# Patient Record
Sex: Male | Born: 1990 | Race: White | Hispanic: No | Marital: Single | Smoking: Current some day smoker
Health system: Southern US, Community
[De-identification: ages and names within clinical notes are randomized; demographics above are authoritative.]

## PROBLEM LIST (undated history)

## (undated) DIAGNOSIS — S52501A Unspecified fracture of the lower end of right radius, initial encounter for closed fracture: Secondary | ICD-10-CM

## (undated) DIAGNOSIS — M12569 Traumatic arthropathy, unspecified knee: Secondary | ICD-10-CM

## (undated) DIAGNOSIS — S83529A Sprain of posterior cruciate ligament of unspecified knee, initial encounter: Secondary | ICD-10-CM

## (undated) DIAGNOSIS — S73005A Unspecified dislocation of left hip, initial encounter: Secondary | ICD-10-CM

## (undated) DIAGNOSIS — S32402A Unspecified fracture of left acetabulum, initial encounter for closed fracture: Secondary | ICD-10-CM

## (undated) HISTORY — PX: OTHER SURGICAL HISTORY: SHX169

---
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
## 2016-01-19 ENCOUNTER — Encounter (HOSPITAL_COMMUNITY): Payer: Self-pay

## 2016-01-19 ENCOUNTER — Emergency Department (HOSPITAL_COMMUNITY): Payer: Self-pay

## 2016-01-19 ENCOUNTER — Emergency Department (HOSPITAL_COMMUNITY): Payer: MEDICAID

## 2016-01-19 ENCOUNTER — Inpatient Hospital Stay (HOSPITAL_COMMUNITY)
Admission: EM | Admit: 2016-01-19 | Discharge: 2016-01-28 | DRG: 481 | Disposition: A | Discharge status: Home / Self Care | Payer: Self-pay | Attending: Orthopedic Surgery | Admitting: Orthopedic Surgery

## 2016-01-19 DIAGNOSIS — S0181XA Laceration without foreign body of other part of head, initial encounter: Secondary | ICD-10-CM | POA: Diagnosis present

## 2016-01-19 DIAGNOSIS — F17210 Nicotine dependence, cigarettes, uncomplicated: Secondary | ICD-10-CM | POA: Diagnosis present

## 2016-01-19 DIAGNOSIS — F10929 Alcohol use, unspecified with intoxication, unspecified: Secondary | ICD-10-CM | POA: Diagnosis present

## 2016-01-19 DIAGNOSIS — D62 Acute posthemorrhagic anemia: Secondary | ICD-10-CM | POA: Diagnosis not present

## 2016-01-19 DIAGNOSIS — S81012A Laceration without foreign body, left knee, initial encounter: Secondary | ICD-10-CM | POA: Diagnosis present

## 2016-01-19 DIAGNOSIS — T148XXA Other injury of unspecified body region, initial encounter: Secondary | ICD-10-CM

## 2016-01-19 DIAGNOSIS — S73005A Unspecified dislocation of left hip, initial encounter: Secondary | ICD-10-CM | POA: Diagnosis present

## 2016-01-19 DIAGNOSIS — M25362 Other instability, left knee: Secondary | ICD-10-CM

## 2016-01-19 DIAGNOSIS — Z419 Encounter for procedure for purposes other than remedying health state, unspecified: Secondary | ICD-10-CM

## 2016-01-19 DIAGNOSIS — S73006A Unspecified dislocation of unspecified hip, initial encounter: Secondary | ICD-10-CM | POA: Diagnosis present

## 2016-01-19 DIAGNOSIS — S62101A Fracture of unspecified carpal bone, right wrist, initial encounter for closed fracture: Secondary | ICD-10-CM

## 2016-01-19 DIAGNOSIS — S83529A Sprain of posterior cruciate ligament of unspecified knee, initial encounter: Secondary | ICD-10-CM | POA: Diagnosis present

## 2016-01-19 DIAGNOSIS — S62109A Fracture of unspecified carpal bone, unspecified wrist, initial encounter for closed fracture: Secondary | ICD-10-CM

## 2016-01-19 DIAGNOSIS — W19XXXA Unspecified fall, initial encounter: Secondary | ICD-10-CM

## 2016-01-19 DIAGNOSIS — S32422A Displaced fracture of posterior wall of left acetabulum, initial encounter for closed fracture: Principal | ICD-10-CM | POA: Diagnosis present

## 2016-01-19 DIAGNOSIS — S32402A Unspecified fracture of left acetabulum, initial encounter for closed fracture: Secondary | ICD-10-CM | POA: Diagnosis present

## 2016-01-19 DIAGNOSIS — S83412A Sprain of medial collateral ligament of left knee, initial encounter: Secondary | ICD-10-CM | POA: Diagnosis present

## 2016-01-19 DIAGNOSIS — M12569 Traumatic arthropathy, unspecified knee: Secondary | ICD-10-CM | POA: Diagnosis present

## 2016-01-19 DIAGNOSIS — G839 Paralytic syndrome, unspecified: Secondary | ICD-10-CM | POA: Diagnosis present

## 2016-01-19 DIAGNOSIS — S52611A Displaced fracture of right ulna styloid process, initial encounter for closed fracture: Secondary | ICD-10-CM | POA: Diagnosis present

## 2016-01-19 DIAGNOSIS — G8918 Other acute postprocedural pain: Secondary | ICD-10-CM

## 2016-01-19 DIAGNOSIS — M21372 Foot drop, left foot: Secondary | ICD-10-CM | POA: Diagnosis present

## 2016-01-19 DIAGNOSIS — S52501A Unspecified fracture of the lower end of right radius, initial encounter for closed fracture: Secondary | ICD-10-CM | POA: Diagnosis present

## 2016-01-19 HISTORY — DX: Unspecified dislocation of left hip, initial encounter: S73.005A

## 2016-01-19 HISTORY — DX: Sprain of posterior cruciate ligament of unspecified knee, initial encounter: S83.529A

## 2016-01-19 HISTORY — DX: Unspecified fracture of the lower end of right radius, initial encounter for closed fracture: S52.501A

## 2016-01-19 HISTORY — DX: Unspecified fracture of left acetabulum, initial encounter for closed fracture: S32.402A

## 2016-01-19 HISTORY — DX: Traumatic arthropathy, unspecified knee: M12.569

## 2016-01-19 LAB — COMPREHENSIVE METABOLIC PANEL
ALBUMIN: 3.3 g/dL — AB (ref 3.5–5.0)
ALT: 71 U/L — AB (ref 17–63)
AST: 160 U/L — AB (ref 15–41)
Alkaline Phosphatase: 37 U/L — ABNORMAL LOW (ref 38–126)
Anion gap: 12 (ref 5–15)
BILIRUBIN TOTAL: 0.5 mg/dL (ref 0.3–1.2)
BUN: 8 mg/dL (ref 6–20)
CO2: 18 mmol/L — ABNORMAL LOW (ref 22–32)
CREATININE: 1.1 mg/dL (ref 0.61–1.24)
Calcium: 8.9 mg/dL (ref 8.9–10.3)
Chloride: 109 mmol/L (ref 101–111)
GFR calc Af Amer: >60 mL/min (ref >60)
GLUCOSE: 139 mg/dL — AB (ref 65–99)
POTASSIUM: 3.6 mmol/L (ref 3.5–5.1)
Sodium: 139 mmol/L (ref 135–145)
TOTAL PROTEIN: 5.9 g/dL — AB (ref 6.5–8.1)

## 2016-01-19 LAB — SAMPLE TO BLOOD BANK

## 2016-01-19 LAB — I-STAT CHEM 8, ED
BUN: 9 mg/dL (ref 6–20)
CHLORIDE: 107 mmol/L (ref 101–111)
CREATININE: 1.2 mg/dL (ref 0.61–1.24)
Calcium, Ion: 1.07 mmol/L — ABNORMAL LOW (ref 1.13–1.30)
Glucose, Bld: 135 mg/dL — ABNORMAL HIGH (ref 65–99)
HEMATOCRIT: 47 % (ref 39.0–52.0)
Hemoglobin: 16 g/dL (ref 13.0–17.0)
POTASSIUM: 3.6 mmol/L (ref 3.5–5.1)
SODIUM: 141 mmol/L (ref 135–145)
TCO2: 18 mmol/L (ref 0–100)

## 2016-01-19 LAB — CBC
HEMATOCRIT: 46.6 % (ref 39.0–52.0)
Hemoglobin: 16 g/dL (ref 13.0–17.0)
MCH: 32 pg (ref 26.0–34.0)
MCHC: 34.3 g/dL (ref 30.0–36.0)
MCV: 93.2 fL (ref 78.0–100.0)
PLATELETS: 215 10*3/uL (ref 150–400)
RBC: 5 MIL/uL (ref 4.22–5.81)
RDW: 14.2 % (ref 11.5–15.5)
WBC: 5.8 10*3/uL (ref 4.0–10.5)

## 2016-01-19 LAB — I-STAT CG4 LACTIC ACID, ED: Lactic Acid, Venous: 4.63 mmol/L (ref 0.5–1.9)

## 2016-01-19 LAB — ETHANOL: Alcohol, Ethyl (B): 139 mg/dL — ABNORMAL HIGH (ref <5)

## 2016-01-19 LAB — PROTIME-INR
INR: 1.02 (ref 0.00–1.49)
Prothrombin Time: 13.6 seconds (ref 11.6–15.2)

## 2016-01-19 MED ORDER — HYDROMORPHONE HCL 1 MG/ML IJ SOLN
1.0000 mg | Freq: Once | INTRAMUSCULAR | Status: AC
  Administered 2016-01-19: 1 mg via INTRAVENOUS

## 2016-01-19 MED ORDER — CEFAZOLIN IN D5W 1 GM/50ML IV SOLN
1.0000 g | Freq: Once | INTRAVENOUS | Status: AC
  Administered 2016-01-20: 1 g via INTRAVENOUS
  Filled 2016-01-19: qty 50

## 2016-01-19 MED ORDER — TETANUS-DIPHTH-ACELL PERTUSSIS 5-2.5-18.5 LF-MCG/0.5 IM SUSP
0.5000 mL | Freq: Once | INTRAMUSCULAR | Status: AC
  Administered 2016-01-20: 0.5 mL via INTRAMUSCULAR
  Filled 2016-01-19: qty 0.5

## 2016-01-19 MED ORDER — HYDROMORPHONE HCL 1 MG/ML IJ SOLN
INTRAMUSCULAR | Status: AC
  Filled 2016-01-19: qty 1

## 2016-01-19 MED ORDER — IOPAMIDOL (ISOVUE-300) INJECTION 61%
INTRAVENOUS | Status: AC
  Administered 2016-01-19: 75 mL via INTRAVENOUS
  Filled 2016-01-19: qty 100

## 2016-01-19 MED ORDER — PROPOFOL 10 MG/ML IV BOLUS
0.5000 mg/kg | Freq: Once | INTRAVENOUS | Status: AC
  Administered 2016-01-20: 39.7 mg via INTRAVENOUS
  Filled 2016-01-19: qty 20

## 2016-01-19 MED ORDER — KETAMINE HCL-SODIUM CHLORIDE 100-0.9 MG/10ML-% IV SOSY
1.0000 mg/kg | PREFILLED_SYRINGE | Freq: Once | INTRAVENOUS | Status: DC
  Filled 2016-01-19: qty 10
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
## 2016-01-19 NOTE — ED Notes (Signed)
Dr. Rhunette CroftNanavati and respiratory at bedside
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
## 2016-01-19 NOTE — ED Notes (Signed)
Patient transported to CT 

## 2016-01-19 NOTE — ED Notes (Signed)
Consent obtained

## 2016-01-20 ENCOUNTER — Inpatient Hospital Stay (HOSPITAL_COMMUNITY): Payer: MEDICAID

## 2016-01-20 ENCOUNTER — Inpatient Hospital Stay (HOSPITAL_COMMUNITY): Payer: Self-pay

## 2016-01-20 ENCOUNTER — Emergency Department (HOSPITAL_COMMUNITY): Payer: Self-pay

## 2016-01-20 DIAGNOSIS — S73005A Unspecified dislocation of left hip, initial encounter: Secondary | ICD-10-CM | POA: Diagnosis present

## 2016-01-20 DIAGNOSIS — S32402A Unspecified fracture of left acetabulum, initial encounter for closed fracture: Secondary | ICD-10-CM | POA: Diagnosis present

## 2016-01-20 DIAGNOSIS — S73006A Unspecified dislocation of unspecified hip, initial encounter: Secondary | ICD-10-CM | POA: Diagnosis present

## 2016-01-20 LAB — RAPID URINE DRUG SCREEN, HOSP PERFORMED
AMPHETAMINES: NOT DETECTED
Barbiturates: NOT DETECTED
Benzodiazepines: NOT DETECTED
COCAINE: NOT DETECTED
OPIATES: POSITIVE — AB
Tetrahydrocannabinol: NOT DETECTED

## 2016-01-20 LAB — URINE MICROSCOPIC-ADD ON
Bacteria, UA: NONE SEEN
RBC / HPF: NONE SEEN RBC/hpf (ref 0–5)

## 2016-01-20 LAB — CBC
HCT: 45.2 % (ref 39.0–52.0)
HEMOGLOBIN: 15.8 g/dL (ref 13.0–17.0)
MCH: 31.9 pg (ref 26.0–34.0)
MCHC: 35 g/dL (ref 30.0–36.0)
MCV: 91.3 fL (ref 78.0–100.0)
Platelets: 218 10*3/uL (ref 150–400)
RBC: 4.95 MIL/uL (ref 4.22–5.81)
RDW: 14.1 % (ref 11.5–15.5)
WBC: 7.3 10*3/uL (ref 4.0–10.5)

## 2016-01-20 LAB — URINALYSIS, ROUTINE W REFLEX MICROSCOPIC
BILIRUBIN URINE: NEGATIVE
Bilirubin Urine: NEGATIVE
GLUCOSE, UA: NEGATIVE mg/dL
Glucose, UA: NEGATIVE mg/dL
KETONES UR: NEGATIVE mg/dL
KETONES UR: 15 mg/dL — AB
Leukocytes, UA: NEGATIVE
Leukocytes, UA: NEGATIVE
NITRITE: NEGATIVE
Nitrite: NEGATIVE
PH: 5.5 (ref 5.0–8.0)
PROTEIN: 100 mg/dL — AB
Protein, ur: NEGATIVE mg/dL
Specific Gravity, Urine: 1.042 — ABNORMAL HIGH (ref 1.005–1.030)
Specific Gravity, Urine: 1.026 (ref 1.005–1.030)
pH: 6.5 (ref 5.0–8.0)

## 2016-01-20 LAB — APTT: aPTT: 28 seconds (ref 24–37)

## 2016-01-20 LAB — COMPREHENSIVE METABOLIC PANEL
ALK PHOS: 37 U/L — AB (ref 38–126)
ALT: 68 U/L — AB (ref 17–63)
AST: 162 U/L — AB (ref 15–41)
Albumin: 3.3 g/dL — ABNORMAL LOW (ref 3.5–5.0)
Anion gap: 5 (ref 5–15)
BUN: 7 mg/dL (ref 6–20)
CALCIUM: 8.8 mg/dL — AB (ref 8.9–10.3)
CHLORIDE: 107 mmol/L (ref 101–111)
CO2: 26 mmol/L (ref 22–32)
CREATININE: 1 mg/dL (ref 0.61–1.24)
Glucose, Bld: 115 mg/dL — ABNORMAL HIGH (ref 65–99)
Potassium: 3.9 mmol/L (ref 3.5–5.1)
SODIUM: 138 mmol/L (ref 135–145)
Total Bilirubin: 1.6 mg/dL — ABNORMAL HIGH (ref 0.3–1.2)
Total Protein: 6.1 g/dL — ABNORMAL LOW (ref 6.5–8.1)

## 2016-01-20 LAB — PROTIME-INR
INR: 0.99 (ref 0.00–1.49)
PROTHROMBIN TIME: 13.3 s (ref 11.6–15.2)

## 2016-01-20 LAB — CDS SEROLOGY

## 2016-01-20 LAB — LACTIC ACID, PLASMA: Lactic Acid, Venous: 1.5 mmol/L (ref 0.5–1.9)

## 2016-01-20 MED ORDER — ENOXAPARIN SODIUM 40 MG/0.4ML ~~LOC~~ SOLN
40.0000 mg | SUBCUTANEOUS | Status: DC
  Administered 2016-01-20 – 2016-01-22 (×3): 40 mg via SUBCUTANEOUS
  Filled 2016-01-20 (×3): qty 0.4

## 2016-01-20 MED ORDER — FENTANYL CITRATE (PF) 100 MCG/2ML IJ SOLN
INTRAMUSCULAR | Status: AC
  Filled 2016-01-20: qty 2

## 2016-01-20 MED ORDER — METHOCARBAMOL 1000 MG/10ML IJ SOLN
500.0000 mg | Freq: Four times a day (QID) | INTRAVENOUS | Status: DC | PRN
  Filled 2016-01-20: qty 5

## 2016-01-20 MED ORDER — VITAMIN B-1 100 MG PO TABS
100.0000 mg | ORAL_TABLET | Freq: Every day | ORAL | Status: DC
  Administered 2016-01-21 – 2016-01-28 (×7): 100 mg via ORAL
  Filled 2016-01-20 (×7): qty 1

## 2016-01-20 MED ORDER — DOCUSATE SODIUM 100 MG PO CAPS
100.0000 mg | ORAL_CAPSULE | Freq: Two times a day (BID) | ORAL | Status: DC
Start: 2016-01-20 — End: 2016-01-24
  Administered 2016-01-20 – 2016-01-23 (×8): 100 mg via ORAL
  Filled 2016-01-20 (×8): qty 1

## 2016-01-20 MED ORDER — ETOMIDATE 2 MG/ML IV SOLN
12.0000 mg | Freq: Once | INTRAVENOUS | Status: DC
  Filled 2016-01-20: qty 10

## 2016-01-20 MED ORDER — ACETAMINOPHEN 325 MG PO TABS: 650.0000 mg | ORAL_TABLET | Freq: Four times a day (QID) | ORAL | Status: DC | PRN

## 2016-01-20 MED ORDER — FENTANYL CITRATE (PF) 100 MCG/2ML IJ SOLN
100.0000 ug | Freq: Once | INTRAMUSCULAR | Status: AC
  Administered 2016-01-20: 100 ug via INTRAVENOUS

## 2016-01-20 MED ORDER — FENTANYL CITRATE (PF) 100 MCG/2ML IJ SOLN
50.0000 ug | Freq: Once | INTRAMUSCULAR | Status: AC
  Administered 2016-01-20: 50 ug via INTRAVENOUS
  Filled 2016-01-20: qty 2

## 2016-01-20 MED ORDER — ACETAMINOPHEN 650 MG RE SUPP: 650.0000 mg | Freq: Four times a day (QID) | RECTAL | Status: DC | PRN

## 2016-01-20 MED ORDER — METHOCARBAMOL 1000 MG/10ML IJ SOLN
1000.0000 mg | Freq: Once | INTRAVENOUS | Status: AC
  Administered 2016-01-20: 1000 mg via INTRAVENOUS
  Filled 2016-01-20: qty 10

## 2016-01-20 MED ORDER — FENTANYL CITRATE (PF) 100 MCG/2ML IJ SOLN
100.0000 ug | Freq: Once | INTRAMUSCULAR | Status: AC
Start: 2016-01-20 — End: 2016-01-20
  Administered 2016-01-20: 100 ug via INTRAVENOUS
  Filled 2016-01-20: qty 2

## 2016-01-20 MED ORDER — SODIUM CHLORIDE 0.9 % IV SOLN
INTRAVENOUS | Status: DC
  Administered 2016-01-20: 04:00:00 via INTRAVENOUS

## 2016-01-20 MED ORDER — FOLIC ACID 1 MG PO TABS
1.0000 mg | ORAL_TABLET | Freq: Every day | ORAL | Status: DC
  Administered 2016-01-21 – 2016-01-28 (×7): 1 mg via ORAL
  Filled 2016-01-20 (×7): qty 1

## 2016-01-20 MED ORDER — POTASSIUM CHLORIDE IN NACL 20-0.9 MEQ/L-% IV SOLN
INTRAVENOUS | Status: DC
  Administered 2016-01-20 – 2016-01-24 (×11): via INTRAVENOUS
  Filled 2016-01-20 (×11): qty 1000

## 2016-01-20 MED ORDER — DIPHENHYDRAMINE HCL 12.5 MG/5ML PO ELIX: 12.5000 mg | ORAL_SOLUTION | ORAL | Status: DC | PRN

## 2016-01-20 MED ORDER — ADULT MULTIVITAMIN W/MINERALS CH
1.0000 | ORAL_TABLET | Freq: Every day | ORAL | Status: DC
  Administered 2016-01-21 – 2016-01-28 (×7): 1 via ORAL
  Filled 2016-01-20 (×7): qty 1

## 2016-01-20 MED ORDER — HYDROMORPHONE HCL 1 MG/ML IJ SOLN
1.0000 mg | INTRAMUSCULAR | Status: DC | PRN
  Administered 2016-01-20 – 2016-01-24 (×28): 1 mg via INTRAVENOUS
  Filled 2016-01-20 (×30): qty 1

## 2016-01-20 MED ORDER — CEFAZOLIN SODIUM-DEXTROSE 2-4 GM/100ML-% IV SOLN
2.0000 g | Freq: Once | INTRAVENOUS | Status: AC
  Administered 2016-01-20: 2 g via INTRAVENOUS
  Filled 2016-01-20 (×2): qty 100

## 2016-01-20 MED ORDER — ONDANSETRON HCL 4 MG/2ML IJ SOLN
4.0000 mg | Freq: Four times a day (QID) | INTRAMUSCULAR | Status: DC | PRN
  Administered 2016-01-24: 4 mg via INTRAVENOUS

## 2016-01-20 MED ORDER — THIAMINE HCL 100 MG/ML IJ SOLN: 100.0000 mg | Freq: Every day | INTRAMUSCULAR | Status: DC

## 2016-01-20 MED ORDER — ETOMIDATE 2 MG/ML IV SOLN
10.0000 mg | Freq: Once | INTRAVENOUS | Status: AC
  Administered 2016-01-20: 10 mg via INTRAVENOUS

## 2016-01-20 MED ORDER — OXYCODONE-ACETAMINOPHEN 5-325 MG PO TABS
1.0000 | ORAL_TABLET | Freq: Four times a day (QID) | ORAL | Status: DC | PRN
  Administered 2016-01-20 – 2016-01-22 (×5): 2 via ORAL
  Administered 2016-01-23: 1 via ORAL
  Administered 2016-01-23 (×2): 2 via ORAL
  Filled 2016-01-20: qty 1
  Filled 2016-01-20 (×9): qty 2

## 2016-01-20 MED ORDER — LORAZEPAM 1 MG PO TABS
1.0000 mg | ORAL_TABLET | Freq: Four times a day (QID) | ORAL | Status: AC | PRN
  Administered 2016-01-20 – 2016-01-23 (×5): 1 mg via ORAL
  Filled 2016-01-20 (×5): qty 1

## 2016-01-20 MED ORDER — LORAZEPAM 2 MG/ML IJ SOLN: 1.0000 mg | Freq: Four times a day (QID) | INTRAMUSCULAR | Status: AC | PRN

## 2016-01-20 MED ORDER — LIDOCAINE-EPINEPHRINE (PF) 2 %-1:200000 IJ SOLN
10.0000 mL | Freq: Once | INTRAMUSCULAR | Status: AC
  Administered 2016-01-20: 10 mL
  Filled 2016-01-20: qty 20

## 2016-01-20 MED ORDER — OXYCODONE HCL 5 MG PO TABS
5.0000 mg | ORAL_TABLET | ORAL | Status: DC | PRN
  Administered 2016-01-21 – 2016-01-24 (×11): 10 mg via ORAL
  Filled 2016-01-20 (×11): qty 2

## 2016-01-20 MED ORDER — LIDOCAINE-EPINEPHRINE 1 %-1:100000 IJ SOLN
20.0000 mL | INTRAMUSCULAR | Status: AC
  Administered 2016-01-20: 20 mL
  Filled 2016-01-20: qty 20

## 2016-01-20 MED ORDER — POLYETHYLENE GLYCOL 3350 17 G PO PACK
17.0000 g | PACK | Freq: Every day | ORAL | Status: DC
  Administered 2016-01-20 – 2016-01-28 (×8): 17 g via ORAL
  Filled 2016-01-20 (×8): qty 1

## 2016-01-20 MED ORDER — METHOCARBAMOL 500 MG PO TABS
500.0000 mg | ORAL_TABLET | Freq: Four times a day (QID) | ORAL | Status: DC | PRN
  Administered 2016-01-20 (×2): 500 mg via ORAL
  Filled 2016-01-20 (×2): qty 1

## 2016-01-20 MED ORDER — KETAMINE HCL-SODIUM CHLORIDE 100-0.9 MG/10ML-% IV SOSY
60.0000 mg | PREFILLED_SYRINGE | Freq: Once | INTRAVENOUS | Status: AC
  Administered 2016-01-20: 60 mg via INTRAVENOUS

## 2016-01-20 MED ORDER — METHOCARBAMOL 500 MG PO TABS
500.0000 mg | ORAL_TABLET | Freq: Four times a day (QID) | ORAL | Status: DC | PRN
Start: 2016-01-20 — End: 2016-01-28
  Administered 2016-01-20 – 2016-01-25 (×13): 1000 mg via ORAL
  Administered 2016-01-25: 500 mg via ORAL
  Administered 2016-01-26 – 2016-01-28 (×3): 1000 mg via ORAL
  Filled 2016-01-20 (×10): qty 2
  Filled 2016-01-20: qty 1
  Filled 2016-01-20 (×6): qty 2

## 2016-01-20 MED ORDER — ACETAMINOPHEN 325 MG PO TABS
650.0000 mg | ORAL_TABLET | Freq: Four times a day (QID) | ORAL | Status: DC | PRN
  Administered 2016-01-20 – 2016-01-21 (×2): 650 mg via ORAL
  Filled 2016-01-20 (×2): qty 2
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
## 2016-01-20 NOTE — Progress Notes (Signed)
Patient ID: Ronald MerlesDeon Meyer, male   DOB: 10/11/1991, 25 y.o.   MRN: [id number] He is awake and alert today.  He continues to have appropriate right wrist pain and some numbness in his right hand as well as left hip pain.  He has been seen by Dr. Jarold SongMichael Handy's PA, Ronald MoritaKeith Meyer, who evaluated his pelvic films and placed his left leg in balanced skeletal traction given the large bone fragment seen on the post reduction CT of his pelvic.  This injury with certainly need Dr. Carola FrostHandy and [facility] expertise as ortho pelvic trauma specialists.  I have spoken to Ronald Meyer in length about his injuries and the need for future surgery.  Will start Lovenox for DVT coverage.  Vitals are stable.

## 2016-01-20 NOTE — Procedures (Signed)
Clinician: Mearl LatinKeith W. Keelin Neville, PA-C  Procedure: Left distal femoral traction pin placement for left acetabulum fracture dislocation. Status post closed reduction. Incarcerated intra-articular fragment left hip  Medications: 50 g of fentanyl, 1000 mg Robaxin IV   15 mL 1% lidocaine with epinephrine, infiltrated into the soft tissue (10 mL medially and 5 mL laterally)   Details:  Injury and treatment were reviewed with the patient. Explained that he has a large intra-articular fragment in his left hip joint following his reduction. You need to place him in skeletal traction to further limit the injury to his joint surfaces on both his femoral head as well as acetabulum. It would also provide him some stability while we await definitive fixation. Patient is agreeable to proceed with traction pin placement.  Clinical exam was completed. Initially plan to place a proximal tibial traction pin however his exam was concerning for possible ligamentous knee injury. As such decided to proceed with distal femoral traction pin placement.  The patient's knee immobilizer was removed. His thigh and knee were propped up on 2 blankets to give us knee flexion. Area in line with the superior pole of the patella was identified both medially and laterally along the distal femur. An area was identified in the midportion of the distal femur in line with this area. The areas were cleaned with alcohol wipe and then Betadine swabs 3. The soft tissue was anesthetized with 1% lidocaine with epi for starting laterally. Aspirating as advancing the needle. Approximately 10 mL of lidocaine was infiltrated in the medial aspect of the distal femoral soft tissue. 5 mL of lidocaine was then infiltrated into the lateral soft tissue. Again aspirating as advancing the needle.  A 2.4 mm K wire was selected. After adequate anesthesia of the soft tissues and take a place a small incision was made medial aspect of the distal thigh. The pin was  inserted and placed down to the bone. Once I was comfortable with the starting point the pin was advanced utilizing power drill through the distal femur and out the lateral aspect of the distal femur. Patient tolerated the procedure very well.   After the pin was advanced through and equal lengths of the pin were noted medially and laterally the tension bow was applied and the ends of the wire were bent up and covered with gauze and tape. 30 pounds of weight were added. The leg was set up in an abducted position with external rotation to provide additional stability. Patient had significant relief of his hip with the application of traction.  Again patient tolerated the procedure extremely well. No complications were noted. Symmetric pulses were noted post procedure. Again he does have some deficit with ankle extension. We did [facility] which also made him feel better. Please see consult note for more definitive plan. Anticipate OR Thursday or Friday of this week  Mearl LatinKeith W. Denisha Hoel, PA-C [facility] [phone] (P) 01/20/2016 5:10 PM

## 2016-01-20 NOTE — ED Notes (Signed)
Patient transported to CT 

## 2016-01-20 NOTE — Progress Notes (Signed)
Orthopedic Tech Progress Note Patient Details:  Arabella MerlesDeon Chauca 10/11/1991 [id number]  Ortho Devices Type of Ortho Device: Ace wrap Ortho Device/Splint Location: rue arm sling and sugartong, lle knee immobilizer Ortho Device/Splint Interventions: Application   Saul FordyceJennifer C Kaisei Gilbo 01/20/2016, 4:58 PM

## 2016-01-20 NOTE — ED Notes (Signed)
Orthopedist at bedside

## 2016-01-20 NOTE — ED Notes (Signed)
Dr. Nanavati at bedside 

## 2016-01-20 NOTE — Progress Notes (Signed)
   01/20/16 0000  Clinical Encounter Type  Visited With Patient  Visit Type Trauma  Referral From Nurse  Stress Factors  Patient Stress Factors Other (Comment) (Pain, lack of support)  Chaplain responded to level 2 trauma, retrieved patient's cell phone and got name of girlfriend. Tried unsuccessfully to reach her. Her phone mailbox was full. Kryslyn Helbig, Chaplain
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
## 2016-01-20 NOTE — Sedation Documentation (Signed)
Kim RT at bedside

## 2016-01-20 NOTE — Progress Notes (Signed)
Orthopedic Tech Progress Note Patient Details:  Ronald MerlesDeon Meyer 10/11/1991 [id number]  Ortho Devices Type of Ortho Device: Arm sling, Sugartong splint, Knee Immobilizer Ortho Device/Splint Location: rue arm sling and sugartong, lle knee immobilizer Ortho Device/Splint Interventions: Ordered, Application Assisted dr with wrist reduction and applied sugartong to same arm with arm sling. Knee immobilizer to lle  Trinna PostMartinez, [name] 01/20/2016, 2:49 AM

## 2016-01-20 NOTE — Progress Notes (Signed)
Orthopedic Tech Progress Note Patient Details:  Ronald MerlesDeon Meyer 10/11/1991 [id number]  Musculoskeletal Traction Type of Traction: Skeletal (Balanced Suspension) Traction Weight: 10 lbs    Saul FordyceJennifer C Alexzandria Massman 01/20/2016, 11:56 AM

## 2016-01-21 LAB — BASIC METABOLIC PANEL
ANION GAP: 5 (ref 5–15)
CALCIUM: 8.5 mg/dL — AB (ref 8.9–10.3)
CO2: 24 mmol/L (ref 22–32)
Chloride: 106 mmol/L (ref 101–111)
Creatinine, Ser: 0.91 mg/dL (ref 0.61–1.24)
GFR calc Af Amer: >60 mL/min (ref >60)
GFR calc non Af Amer: >60 mL/min (ref >60)
GLUCOSE: 108 mg/dL — AB (ref 65–99)
POTASSIUM: 4.3 mmol/L (ref 3.5–5.1)
Sodium: 135 mmol/L (ref 135–145)

## 2016-01-21 LAB — GLUCOSE, CAPILLARY: Glucose-Capillary: 155 mg/dL — ABNORMAL HIGH (ref 65–99)

## 2016-01-21 LAB — CBC
HEMATOCRIT: 40 % (ref 39.0–52.0)
Hemoglobin: 14 g/dL (ref 13.0–17.0)
MCH: 32 pg (ref 26.0–34.0)
MCHC: 35 g/dL (ref 30.0–36.0)
MCV: 91.5 fL (ref 78.0–100.0)
Platelets: 156 10*3/uL (ref 150–400)
RBC: 4.37 MIL/uL (ref 4.22–5.81)
RDW: 14 % (ref 11.5–15.5)
WBC: 4.1 10*3/uL (ref 4.0–10.5)

## 2016-01-21 MED ORDER — HYDRALAZINE HCL 20 MG/ML IJ SOLN: 5.0000 mg | Freq: Four times a day (QID) | INTRAMUSCULAR | Status: DC | PRN

## 2016-01-21 NOTE — Progress Notes (Signed)
PT Cancellation and Discharge Note  Patient Details Name: Ronald Meyer MRN: [id number] DOB: 10/11/1991   Cancelled Treatment:    Reason Eval/Treat Not Completed: Patient not medically ready.  Per Ortho PA note on 7/2, pt is to remain on bedrest with plan for OR later in week.  Will D/C PT order and need new order once pt is appropriate for PT and mobility.     Sunny SchleinRitenour, Isayah Ignasiak F, [place] [phone] 01/21/2016, 8:14 AM

## 2016-01-21 NOTE — Progress Notes (Signed)
Patient ID: Ronald MerlesDeon Meyer, male   DOB: 10/11/1991, 25 y.o.   MRN: [id number] Resting as comfortably as possible in balanced traction thru his left femur.  Still with left foot drop.  Right wrist splinted.  Fingers well perfused with slight numbness in median nerve distribution.  Abdomen soft.  Vitals stable.  Surgery late in the week this week.  Lovenox started yesterday.

## 2016-01-22 MED ORDER — ZOLPIDEM TARTRATE 5 MG PO TABS
5.0000 mg | ORAL_TABLET | Freq: Every evening | ORAL | Status: DC | PRN
  Administered 2016-01-24 – 2016-01-27 (×4): 5 mg via ORAL
  Filled 2016-01-22 (×4): qty 1

## 2016-01-22 NOTE — Progress Notes (Signed)
Patient ID: Ronald MerlesDeon Meyer, male   DOB: 24-Nov-1990, 25 y.o.   MRN: [id number] No acute changes.  Vitals stable.  Still with numbness in right hand.  Skeletal traction thru his left distal femur.  Intact sensation over his left foot.  He can dorsiflex his left foot now (no foot drop).  Continue Lovenox.  Dr. Carola FrostHandy to likely take to surgery late this week.  The patient understands the plan.

## 2016-01-23 LAB — CBC
HCT: 33.1 % — ABNORMAL LOW (ref 39.0–52.0)
Hemoglobin: 11.5 g/dL — ABNORMAL LOW (ref 13.0–17.0)
MCH: 32.1 pg (ref 26.0–34.0)
MCHC: 34.7 g/dL (ref 30.0–36.0)
MCV: 92.5 fL (ref 78.0–100.0)
PLATELETS: 164 10*3/uL (ref 150–400)
RBC: 3.58 MIL/uL — AB (ref 4.22–5.81)
RDW: 13.7 % (ref 11.5–15.5)
WBC: 3.6 10*3/uL — AB (ref 4.0–10.5)

## 2016-01-23 MED ORDER — PANTOPRAZOLE SODIUM 40 MG PO TBEC
40.0000 mg | DELAYED_RELEASE_TABLET | Freq: Every day | ORAL | Status: DC
  Administered 2016-01-23 – 2016-01-28 (×5): 40 mg via ORAL
  Filled 2016-01-23 (×5): qty 1

## 2016-01-23 MED ORDER — CEFAZOLIN SODIUM-DEXTROSE 2-4 GM/100ML-% IV SOLN
2.0000 g | Freq: Once | INTRAVENOUS | Status: AC
  Administered 2016-01-24 (×2): 2 g via INTRAVENOUS
  Filled 2016-01-23 (×2): qty 100

## 2016-01-23 NOTE — Progress Notes (Signed)
Patient ID: Ronald MerlesDeon Cortopassi, male   DOB: [birth year], 25 y.o.   MRN: [id number] No acute changes.  Vitals stable.  Still with right wrist pain and finger numbness to be expected given his distal radius fracture.  Traction left LE to take pressure off of left hip and reduce risk of cartilage damage given large intra-articular bone fragment.  He understands that surgery is later this week by Dr. Carola FrostHandy, Ortho Trauma, given the need for expertise in pelvic surgery and the complexity of this injury.
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
## 2016-01-24 ENCOUNTER — Inpatient Hospital Stay (HOSPITAL_COMMUNITY): Payer: MEDICAID | Admitting: Anesthesiology

## 2016-01-24 ENCOUNTER — Inpatient Hospital Stay (HOSPITAL_COMMUNITY): Payer: Self-pay

## 2016-01-24 ENCOUNTER — Encounter (HOSPITAL_COMMUNITY)
Admission: EM | Disposition: A | Discharge status: Home / Self Care | Payer: Self-pay | Source: Home / Self Care | Attending: Orthopedic Surgery

## 2016-01-24 ENCOUNTER — Telehealth: Payer: Self-pay | Admitting: *Deleted

## 2016-01-24 ENCOUNTER — Encounter (HOSPITAL_COMMUNITY): Payer: Self-pay | Admitting: Anesthesiology

## 2016-01-24 ENCOUNTER — Inpatient Hospital Stay (HOSPITAL_COMMUNITY): Payer: Self-pay | Admitting: Anesthesiology

## 2016-01-24 ENCOUNTER — Inpatient Hospital Stay (HOSPITAL_COMMUNITY): Payer: MEDICAID

## 2016-01-24 HISTORY — PX: ORIF ACETABULAR FRACTURE: SHX5029

## 2016-01-24 HISTORY — PX: ORIF WRIST FRACTURE: SHX2133

## 2016-01-24 LAB — TYPE AND SCREEN
ABO/RH(D): A POS
Antibody Screen: NEGATIVE

## 2016-01-24 LAB — CBC
HCT: 34.4 % — ABNORMAL LOW (ref 39.0–52.0)
HEMATOCRIT: 29.4 % — AB (ref 39.0–52.0)
HEMOGLOBIN: 10.2 g/dL — AB (ref 13.0–17.0)
Hemoglobin: 11.9 g/dL — ABNORMAL LOW (ref 13.0–17.0)
MCH: 31.6 pg (ref 26.0–34.0)
MCH: 31.6 pg (ref 26.0–34.0)
MCHC: 34.6 g/dL (ref 30.0–36.0)
MCHC: 34.7 g/dL (ref 30.0–36.0)
MCV: 91 fL (ref 78.0–100.0)
MCV: 91.2 fL (ref 78.0–100.0)
PLATELETS: 192 10*3/uL (ref 150–400)
Platelets: 199 10*3/uL (ref 150–400)
RBC: 3.23 MIL/uL — ABNORMAL LOW (ref 4.22–5.81)
RBC: 3.77 MIL/uL — ABNORMAL LOW (ref 4.22–5.81)
RDW: 13.6 % (ref 11.5–15.5)
RDW: 13.6 % (ref 11.5–15.5)
WBC: 3.3 10*3/uL — ABNORMAL LOW (ref 4.0–10.5)
WBC: 6 10*3/uL (ref 4.0–10.5)

## 2016-01-24 LAB — COMPREHENSIVE METABOLIC PANEL
ALK PHOS: 53 U/L (ref 38–126)
ALT: 95 U/L — ABNORMAL HIGH (ref 17–63)
ANION GAP: 6 (ref 5–15)
AST: 110 U/L — ABNORMAL HIGH (ref 15–41)
Albumin: 2.5 g/dL — ABNORMAL LOW (ref 3.5–5.0)
BILIRUBIN TOTAL: 0.9 mg/dL (ref 0.3–1.2)
CALCIUM: 8.7 mg/dL — AB (ref 8.9–10.3)
CO2: 27 mmol/L (ref 22–32)
Chloride: 105 mmol/L (ref 101–111)
Creatinine, Ser: 0.8 mg/dL (ref 0.61–1.24)
GFR calc Af Amer: >60 mL/min (ref >60)
GLUCOSE: 97 mg/dL (ref 65–99)
POTASSIUM: 4.2 mmol/L (ref 3.5–5.1)
Sodium: 138 mmol/L (ref 135–145)
TOTAL PROTEIN: 4.8 g/dL — AB (ref 6.5–8.1)

## 2016-01-24 LAB — CREATININE, SERUM: Creatinine, Ser: 0.83 mg/dL (ref 0.61–1.24)

## 2016-01-24 LAB — APTT: aPTT: 33 seconds (ref 24–37)

## 2016-01-24 LAB — ABO/RH: ABO/RH(D): A POS

## 2016-01-24 LAB — PROTIME-INR
INR: 0.93 (ref 0.00–1.49)
PROTHROMBIN TIME: 12.6 s (ref 11.6–15.2)

## 2016-01-24 SURGERY — OPEN REDUCTION INTERNAL FIXATION (ORIF) ACETABULAR FRACTURE
Anesthesia: General | Laterality: Right

## 2016-01-24 MED ORDER — FENTANYL CITRATE (PF) 250 MCG/5ML IJ SOLN
INTRAMUSCULAR | Status: AC
  Filled 2016-01-24: qty 5

## 2016-01-24 MED ORDER — FENTANYL CITRATE (PF) 100 MCG/2ML IJ SOLN
INTRAMUSCULAR | Status: AC
  Filled 2016-01-24: qty 2

## 2016-01-24 MED ORDER — OXYCODONE HCL 5 MG/5ML PO SOLN: 10.0000 mg | Freq: Once | ORAL | Status: AC | PRN

## 2016-01-24 MED ORDER — SODIUM CHLORIDE 0.9% FLUSH: 9.0000 mL | INTRAVENOUS | Status: DC | PRN

## 2016-01-24 MED ORDER — DOCUSATE SODIUM 100 MG PO CAPS
100.0000 mg | ORAL_CAPSULE | Freq: Two times a day (BID) | ORAL | Status: DC
  Administered 2016-01-24 – 2016-01-28 (×8): 100 mg via ORAL
  Filled 2016-01-24 (×8): qty 1

## 2016-01-24 MED ORDER — 0.9 % SODIUM CHLORIDE (POUR BTL) OPTIME
TOPICAL | Status: DC | PRN
  Administered 2016-01-24 (×2): 1000 mL

## 2016-01-24 MED ORDER — METOCLOPRAMIDE HCL 5 MG/ML IJ SOLN: 5.0000 mg | Freq: Three times a day (TID) | INTRAMUSCULAR | Status: DC | PRN

## 2016-01-24 MED ORDER — HYDROMORPHONE HCL 1 MG/ML IJ SOLN
INTRAMUSCULAR | Status: AC
  Administered 2016-01-24: 0.5 mg via INTRAVENOUS
  Filled 2016-01-24: qty 1

## 2016-01-24 MED ORDER — LACTATED RINGERS IV SOLN
INTRAVENOUS | Status: DC
  Administered 2016-01-24 (×4): via INTRAVENOUS

## 2016-01-24 MED ORDER — PHENYLEPHRINE HCL 10 MG/ML IJ SOLN
INTRAMUSCULAR | Status: DC | PRN
  Administered 2016-01-24 (×2): 80 ug via INTRAVENOUS
  Administered 2016-01-24: 40 ug via INTRAVENOUS

## 2016-01-24 MED ORDER — FENTANYL CITRATE (PF) 100 MCG/2ML IJ SOLN
INTRAMUSCULAR | Status: DC | PRN
  Administered 2016-01-24: 50 ug via INTRAVENOUS
  Administered 2016-01-24: 100 ug via INTRAVENOUS
  Administered 2016-01-24 (×4): 50 ug via INTRAVENOUS
  Administered 2016-01-24: 150 ug via INTRAVENOUS

## 2016-01-24 MED ORDER — PROMETHAZINE HCL 25 MG/ML IJ SOLN: 6.2500 mg | INTRAMUSCULAR | Status: DC | PRN

## 2016-01-24 MED ORDER — LIDOCAINE 2% (20 MG/ML) 5 ML SYRINGE
INTRAMUSCULAR | Status: AC
  Filled 2016-01-24: qty 5

## 2016-01-24 MED ORDER — PROPOFOL 10 MG/ML IV BOLUS
INTRAVENOUS | Status: DC | PRN
  Administered 2016-01-24: 200 mg via INTRAVENOUS

## 2016-01-24 MED ORDER — SUGAMMADEX SODIUM 200 MG/2ML IV SOLN
INTRAVENOUS | Status: DC | PRN
  Administered 2016-01-24: 158.8 mg via INTRAVENOUS

## 2016-01-24 MED ORDER — ENOXAPARIN SODIUM 40 MG/0.4ML ~~LOC~~ SOLN
40.0000 mg | SUBCUTANEOUS | Status: DC
  Administered 2016-01-25 – 2016-01-28 (×4): 40 mg via SUBCUTANEOUS
  Filled 2016-01-24 (×4): qty 0.4

## 2016-01-24 MED ORDER — POTASSIUM CHLORIDE IN NACL 20-0.9 MEQ/L-% IV SOLN
INTRAVENOUS | Status: DC
  Filled 2016-01-24 (×3): qty 1000

## 2016-01-24 MED ORDER — KETAMINE HCL 100 MG/ML IJ SOLN
INTRAMUSCULAR | Status: AC
  Filled 2016-01-24: qty 1

## 2016-01-24 MED ORDER — ALBUMIN HUMAN 5 % IV SOLN
INTRAVENOUS | Status: DC | PRN
  Administered 2016-01-24 (×2): via INTRAVENOUS

## 2016-01-24 MED ORDER — SODIUM CHLORIDE 0.9 % IV SOLN
10.0000 mg | INTRAVENOUS | Status: DC | PRN
  Administered 2016-01-24: 10 ug/min via INTRAVENOUS

## 2016-01-24 MED ORDER — NALOXONE HCL 0.4 MG/ML IJ SOLN: 0.4000 mg | INTRAMUSCULAR | Status: DC | PRN

## 2016-01-24 MED ORDER — CEFAZOLIN IN D5W 1 GM/50ML IV SOLN
1.0000 g | Freq: Three times a day (TID) | INTRAVENOUS | Status: AC
  Administered 2016-01-24 – 2016-01-26 (×6): 1 g via INTRAVENOUS
  Filled 2016-01-24 (×6): qty 50

## 2016-01-24 MED ORDER — TRAMADOL HCL 50 MG PO TABS
50.0000 mg | ORAL_TABLET | Freq: Three times a day (TID) | ORAL | Status: DC | PRN
  Administered 2016-01-24: 50 mg via ORAL

## 2016-01-24 MED ORDER — ACETAMINOPHEN 325 MG PO TABS: 650.0000 mg | ORAL_TABLET | Freq: Four times a day (QID) | ORAL | Status: DC | PRN

## 2016-01-24 MED ORDER — HYDROMORPHONE 1 MG/ML IV SOLN
INTRAVENOUS | Status: DC
  Administered 2016-01-24: 14:00:00 via INTRAVENOUS
  Administered 2016-01-24: 4.2 mg via INTRAVENOUS
  Administered 2016-01-25: 5.1 mg via INTRAVENOUS
  Administered 2016-01-25 (×2): 1.8 mg via INTRAVENOUS
  Administered 2016-01-25: 0.5 mg via INTRAVENOUS
  Administered 2016-01-25: 4.2 mg via INTRAVENOUS
  Administered 2016-01-25: 3.3 mg via INTRAVENOUS
  Filled 2016-01-24: qty 25

## 2016-01-24 MED ORDER — LIDOCAINE HCL (CARDIAC) 20 MG/ML IV SOLN
INTRAVENOUS | Status: DC | PRN
  Administered 2016-01-24 (×2): 50 mg via INTRAVENOUS
  Administered 2016-01-24: 100 mg via INTRAVENOUS

## 2016-01-24 MED ORDER — MIDAZOLAM HCL 2 MG/2ML IJ SOLN
INTRAMUSCULAR | Status: AC
  Filled 2016-01-24: qty 2

## 2016-01-24 MED ORDER — ONDANSETRON HCL 4 MG/2ML IJ SOLN: 4.0000 mg | Freq: Four times a day (QID) | INTRAMUSCULAR | Status: DC | PRN

## 2016-01-24 MED ORDER — ARTIFICIAL TEARS OP OINT
TOPICAL_OINTMENT | OPHTHALMIC | Status: DC | PRN
  Administered 2016-01-24: 1 via OPHTHALMIC

## 2016-01-24 MED ORDER — DIPHENHYDRAMINE HCL 12.5 MG/5ML PO ELIX: 12.5000 mg | ORAL_SOLUTION | Freq: Four times a day (QID) | ORAL | Status: DC | PRN

## 2016-01-24 MED ORDER — KETAMINE HCL 10 MG/ML IJ SOLN
INTRAMUSCULAR | Status: DC | PRN
  Administered 2016-01-24: 50 mg via INTRAVENOUS
  Administered 2016-01-24: 10 mg via INTRAVENOUS
  Administered 2016-01-24: 40 mg via INTRAVENOUS

## 2016-01-24 MED ORDER — ACETAMINOPHEN 10 MG/ML IV SOLN
INTRAVENOUS | Status: AC
  Filled 2016-01-24: qty 100

## 2016-01-24 MED ORDER — DIPHENHYDRAMINE HCL 50 MG/ML IJ SOLN: 12.5000 mg | Freq: Four times a day (QID) | INTRAMUSCULAR | Status: DC | PRN

## 2016-01-24 MED ORDER — OXYCODONE HCL 5 MG PO TABS
10.0000 mg | ORAL_TABLET | Freq: Once | ORAL | Status: AC | PRN
  Administered 2016-01-24: 10 mg via ORAL

## 2016-01-24 MED ORDER — COUMADIN BOOK
Freq: Once | Status: DC
  Filled 2016-01-24: qty 1

## 2016-01-24 MED ORDER — HYDROMORPHONE HCL 1 MG/ML IJ SOLN
0.5000 mg | INTRAMUSCULAR | Status: DC | PRN
  Administered 2016-01-24 (×4): 0.5 mg via INTRAVENOUS

## 2016-01-24 MED ORDER — TRAMADOL HCL 50 MG PO TABS
ORAL_TABLET | ORAL | Status: AC
  Filled 2016-01-24: qty 1

## 2016-01-24 MED ORDER — ROCURONIUM BROMIDE 50 MG/5ML IV SOLN
INTRAVENOUS | Status: AC
  Filled 2016-01-24: qty 1

## 2016-01-24 MED ORDER — ACETAMINOPHEN 10 MG/ML IV SOLN
INTRAVENOUS | Status: DC | PRN
  Administered 2016-01-24: 1000 mg via INTRAVENOUS

## 2016-01-24 MED ORDER — ONDANSETRON HCL 4 MG/2ML IJ SOLN
INTRAMUSCULAR | Status: AC
  Filled 2016-01-24: qty 2

## 2016-01-24 MED ORDER — METOCLOPRAMIDE HCL 5 MG PO TABS: 5.0000 mg | ORAL_TABLET | Freq: Three times a day (TID) | ORAL | Status: DC | PRN

## 2016-01-24 MED ORDER — EPHEDRINE SULFATE 50 MG/ML IJ SOLN
INTRAMUSCULAR | Status: DC | PRN
Start: 2016-01-24 — End: 2016-01-24
  Administered 2016-01-24: 10 mg via INTRAVENOUS

## 2016-01-24 MED ORDER — OXYCODONE HCL 5 MG PO TABS
ORAL_TABLET | ORAL | Status: AC
  Administered 2016-01-24: 10 mg via ORAL
  Filled 2016-01-24: qty 2

## 2016-01-24 MED ORDER — WARFARIN SODIUM 7.5 MG PO TABS
7.5000 mg | ORAL_TABLET | Freq: Every day | ORAL | Status: DC
  Administered 2016-01-24 – 2016-01-25 (×2): 7.5 mg via ORAL
  Filled 2016-01-24 (×2): qty 1

## 2016-01-24 MED ORDER — PROPOFOL 10 MG/ML IV BOLUS
INTRAVENOUS | Status: AC
  Filled 2016-01-24: qty 40

## 2016-01-24 MED ORDER — ACETAMINOPHEN 650 MG RE SUPP: 650.0000 mg | Freq: Four times a day (QID) | RECTAL | Status: DC | PRN

## 2016-01-24 MED ORDER — WARFARIN - PHARMACIST DOSING INPATIENT
Freq: Every day | Status: DC
  Administered 2016-01-25 – 2016-01-27 (×2)

## 2016-01-24 MED ORDER — ROCURONIUM BROMIDE 100 MG/10ML IV SOLN
INTRAVENOUS | Status: DC | PRN
  Administered 2016-01-24: 10 mg via INTRAVENOUS
  Administered 2016-01-24 (×2): 20 mg via INTRAVENOUS
  Administered 2016-01-24: 50 mg via INTRAVENOUS

## 2016-01-24 MED ORDER — OXYCODONE HCL 5 MG PO TABS
5.0000 mg | ORAL_TABLET | ORAL | Status: DC | PRN
  Administered 2016-01-25: 10 mg via ORAL
  Administered 2016-01-25: 5 mg via ORAL
  Filled 2016-01-24: qty 2
  Filled 2016-01-24: qty 1

## 2016-01-24 MED ORDER — ARTIFICIAL TEARS OP OINT
TOPICAL_OINTMENT | OPHTHALMIC | Status: AC
  Filled 2016-01-24: qty 3.5

## 2016-01-24 MED ORDER — HYDROMORPHONE 1 MG/ML IV SOLN
INTRAVENOUS | Status: AC
  Filled 2016-01-24: qty 25

## 2016-01-24 MED ORDER — BISACODYL 5 MG PO TBEC: 5.0000 mg | DELAYED_RELEASE_TABLET | Freq: Every day | ORAL | Status: DC | PRN

## 2016-01-24 MED ORDER — WARFARIN VIDEO: Freq: Once | Status: DC

## 2016-01-24 MED ORDER — POLYETHYLENE GLYCOL 3350 17 G PO PACK: 17.0000 g | PACK | Freq: Every day | ORAL | Status: DC

## 2016-01-24 MED ORDER — ONDANSETRON HCL 4 MG PO TABS: 4.0000 mg | ORAL_TABLET | Freq: Four times a day (QID) | ORAL | Status: DC | PRN

## 2016-01-24 MED ORDER — MIDAZOLAM HCL 5 MG/5ML IJ SOLN
INTRAMUSCULAR | Status: DC | PRN
  Administered 2016-01-24: 2 mg via INTRAVENOUS

## 2016-01-24 SURGICAL SUPPLY — 123 items
ANCHOR JUGGERKNOT SZ1 (Anchor) ×4 IMPLANT
APPLIER CLIP 11 MED OPEN (CLIP)
BANDAGE ELASTIC 4 VELCRO ST LF (GAUZE/BANDAGES/DRESSINGS) ×4 IMPLANT
BIT DRILL 2.2 SS TIBIAL (BIT) ×4 IMPLANT
BIT DRILL STEP 3.5 (DRILL) IMPLANT
BLADE AVERAGE 25MMX9MM (BLADE) ×1
BLADE AVERAGE 25X9 (BLADE) ×3 IMPLANT
BLADE SURG ROTATE 9660 (MISCELLANEOUS) ×4 IMPLANT
BNDG ESMARK 4X9 LF (GAUZE/BANDAGES/DRESSINGS) ×4 IMPLANT
BRUSH SCRUB DISP (MISCELLANEOUS) ×8 IMPLANT
CLIP APPLIE 11 MED OPEN (CLIP) IMPLANT
CLOSURE WOUND 1/2 X4 (GAUZE/BANDAGES/DRESSINGS) ×1
CORDS BIPOLAR (ELECTRODE) ×4 IMPLANT
COVER SURGICAL LIGHT HANDLE (MISCELLANEOUS) ×16 IMPLANT
DECANTER SPIKE VIAL GLASS SM (MISCELLANEOUS) IMPLANT
DRAIN CHANNEL 10F 3/8 F FF (DRAIN) IMPLANT
DRAIN CHANNEL 15F RND FF W/TCR (WOUND CARE) IMPLANT
DRAPE C-ARM 42X72 X-RAY (DRAPES) ×4 IMPLANT
DRAPE C-ARMOR (DRAPES) ×4 IMPLANT
DRAPE INCISE IOBAN 85X60 (DRAPES) ×8 IMPLANT
DRAPE OEC MINIVIEW 54X84 (DRAPES) ×4 IMPLANT
DRAPE ORTHO SPLIT 77X108 STRL (DRAPES) ×4
DRAPE SURG ORHT 6 SPLT 77X108 (DRAPES) ×4 IMPLANT
DRAPE U-SHAPE 47X51 STRL (DRAPES) ×4 IMPLANT
DRILL STEP 3.5 (DRILL)
DRSG ADAPTIC 3X8 NADH LF (GAUZE/BANDAGES/DRESSINGS) ×4 IMPLANT
DRSG EMULSION OIL 3X3 NADH (GAUZE/BANDAGES/DRESSINGS) ×4 IMPLANT
DRSG MEPILEX BORDER 4X12 (GAUZE/BANDAGES/DRESSINGS) ×4 IMPLANT
DRSG MEPILEX BORDER 4X8 (GAUZE/BANDAGES/DRESSINGS) IMPLANT
ELECT BLADE 6.5 EXT (BLADE) IMPLANT
ELECT REM PT RETURN 9FT ADLT (ELECTROSURGICAL) ×4
ELECTRODE REM PT RTRN 9FT ADLT (ELECTROSURGICAL) ×2 IMPLANT
EVACUATOR 1/8 PVC DRAIN (DRAIN) IMPLANT
EVACUATOR SILICONE 100CC (DRAIN) ×4 IMPLANT
GAUZE SPONGE 4X4 12PLY STRL (GAUZE/BANDAGES/DRESSINGS) ×4 IMPLANT
GAUZE SPONGE 4X4 16PLY XRAY LF (GAUZE/BANDAGES/DRESSINGS) IMPLANT
GLOVE BIO SURGEON STRL SZ7.5 (GLOVE) ×12 IMPLANT
GLOVE BIO SURGEON STRL SZ8 (GLOVE) ×12 IMPLANT
GLOVE BIOGEL PI IND STRL 7.5 (GLOVE) ×6 IMPLANT
GLOVE BIOGEL PI IND STRL 8 (GLOVE) ×6 IMPLANT
GLOVE BIOGEL PI INDICATOR 7.5 (GLOVE) ×6
GLOVE BIOGEL PI INDICATOR 8 (GLOVE) ×6
GOWN STRL REUS W/ TWL LRG LVL3 (GOWN DISPOSABLE) ×8 IMPLANT
GOWN STRL REUS W/ TWL XL LVL3 (GOWN DISPOSABLE) ×4 IMPLANT
GOWN STRL REUS W/TWL LRG LVL3 (GOWN DISPOSABLE) ×8
GOWN STRL REUS W/TWL XL LVL3 (GOWN DISPOSABLE) ×4
HANDPIECE INTERPULSE COAX TIP (DISPOSABLE)
ILLUMINATOR WAVEGUIDE N/F (MISCELLANEOUS) ×4 IMPLANT
K-WIRE 1.6 (WIRE) ×2
K-WIRE FX5X1.6XNS BN SS (WIRE) ×2
KIT BASIN OR (CUSTOM PROCEDURE TRAY) ×4 IMPLANT
KIT ROOM TURNOVER OR (KITS) ×4 IMPLANT
KWIRE FX5X1.6XNS BN SS (WIRE) ×2 IMPLANT
LIGHT ORTHO (MISCELLANEOUS) ×4 IMPLANT
LOOP VESSEL MAXI BLUE (MISCELLANEOUS) IMPLANT
MANIFOLD NEPTUNE II (INSTRUMENTS) ×4 IMPLANT
NDL SUT 2 .5 CRC MAYO 1.732X (NEEDLE) ×2 IMPLANT
NDL SUT 6 .5 CRC .975X.05 MAYO (NEEDLE) ×2 IMPLANT
NEEDLE HYPO 25GX1X1/2 BEV (NEEDLE) IMPLANT
NEEDLE MAYO TAPER (NEEDLE) ×4
NS IRRIG 1000ML POUR BTL (IV SOLUTION) ×8 IMPLANT
PACK ORTHO EXTREMITY (CUSTOM PROCEDURE TRAY) ×4 IMPLANT
PACK TOTAL JOINT (CUSTOM PROCEDURE TRAY) ×4 IMPLANT
PAD ABD 8X10 STRL (GAUZE/BANDAGES/DRESSINGS) ×4 IMPLANT
PAD ARMBOARD 7.5X6 YLW CONV (MISCELLANEOUS) ×8 IMPLANT
PAD CAST 3X4 CTTN HI CHSV (CAST SUPPLIES) ×2 IMPLANT
PADDING CAST COTTON 3X4 STRL (CAST SUPPLIES) ×2
PEG LOCKING SMOOTH 2.2X18 (Peg) ×4 IMPLANT
PEG LOCKING SMOOTH 2.2X20 (Screw) ×8 IMPLANT
PEG LOCKING SMOOTH 2.2X22 (Screw) ×8 IMPLANT
PEG LOCKING SMOOTH 2.2X24 (Peg) ×16 IMPLANT
PIN APEX 6X180MM EXFIX (EXFIX) ×4 IMPLANT
PLATE ACET STRT 94.5M 8H (Plate) ×4 IMPLANT
PLATE PROXIMAL ACULOC 2 WD RT (Plate) ×4 IMPLANT
PLATE SPRING 2H 3.5MM PELVIC (Plate) ×4 IMPLANT
PLATE WIDE DVR RIGHT (Plate) ×8 IMPLANT
RETRACTOR YANK SUCT EIGR SABER (INSTRUMENTS) ×4 IMPLANT
RETRIEVER SUT HEWSON (MISCELLANEOUS) ×8 IMPLANT
SCREW  LP NL 2.7X15MM (Screw) ×2 IMPLANT
SCREW  LP NL 2.7X16MM (Screw) ×2 IMPLANT
SCREW 2.7X14MM (Screw) ×2 IMPLANT
SCREW BN 14X2.7XNONLOCK 3 LD (Screw) ×2 IMPLANT
SCREW CORT 2.5XFT 44X3.5XST (Screw) ×2 IMPLANT
SCREW CORTEX ST MATTA 3.5X28MM (Screw) ×8 IMPLANT
SCREW CORTEX ST MATTA 3.5X30MM (Screw) ×8 IMPLANT
SCREW CORTEX ST MATTA 3.5X32MM (Screw) ×4 IMPLANT
SCREW CORTICAL 3.5X44MM (Screw) ×2 IMPLANT
SCREW LOCK 24X2.7X3 LD THRD (Screw) ×2 IMPLANT
SCREW LOCKING 2.7X24MM (Screw) ×2 IMPLANT
SCREW LP NL 2.7X15MM (Screw) ×2 IMPLANT
SCREW LP NL 2.7X16MM (Screw) ×2 IMPLANT
SET HNDPC FAN SPRY TIP SCT (DISPOSABLE) IMPLANT
SET JUGGERKNOT DISP 1.4MM ×4 IMPLANT
SPLINT FIBERGLASS 4X15 (CAST SUPPLIES) ×4 IMPLANT
SPONGE LAP 18X18 X RAY DECT (DISPOSABLE) IMPLANT
SPONGE SCRUB IODOPHOR (GAUZE/BANDAGES/DRESSINGS) ×4 IMPLANT
STAPLER VISISTAT 35W (STAPLE) ×4 IMPLANT
STRIP CLOSURE SKIN 1/2X4 (GAUZE/BANDAGES/DRESSINGS) ×3 IMPLANT
SUCTION FRAZIER HANDLE 10FR (MISCELLANEOUS) ×4
SUCTION TUBE FRAZIER 10FR DISP (MISCELLANEOUS) ×4 IMPLANT
SUT ETHILON 2 0 PSLX (SUTURE) ×12 IMPLANT
SUT ETHILON 3 0 PS 1 (SUTURE) ×12 IMPLANT
SUT FIBERWIRE #2 38 T-5 BLUE (SUTURE) ×8
SUT VIC AB 0 CT1 27 (SUTURE) ×2
SUT VIC AB 0 CT1 27XBRD ANBCTR (SUTURE) ×2 IMPLANT
SUT VIC AB 0 CT2 27 (SUTURE) ×8 IMPLANT
SUT VIC AB 1 CT1 18XCR BRD 8 (SUTURE) ×4 IMPLANT
SUT VIC AB 1 CT1 8-18 (SUTURE) ×4
SUT VIC AB 2-0 CT1 27 (SUTURE) ×4
SUT VIC AB 2-0 CT1 TAPERPNT 27 (SUTURE) ×4 IMPLANT
SUT VIC AB 2-0 CT3 27 (SUTURE) IMPLANT
SUT VIC AB 2-0 SH 27 (SUTURE) ×4
SUT VIC AB 2-0 SH 27XBRD (SUTURE) ×4 IMPLANT
SUTURE FIBERWR #2 38 T-5 BLUE (SUTURE) ×4 IMPLANT
SYR CONTROL 10ML LL (SYRINGE) IMPLANT
TOWEL OR 17X24 6PK STRL BLUE (TOWEL DISPOSABLE) ×4 IMPLANT
TOWEL OR 17X26 10 PK STRL BLUE (TOWEL DISPOSABLE) ×8 IMPLANT
TRAY FOLEY CATH 16FRSI W/METER (SET/KITS/TRAYS/PACK) IMPLANT
TUBE CONNECTING 12'X1/4 (SUCTIONS) ×2
TUBE CONNECTING 12X1/4 (SUCTIONS) ×6 IMPLANT
UNDERPAD 30X30 INCONTINENT (UNDERPADS AND DIAPERS) ×4 IMPLANT
WATER STERILE IRR 1000ML POUR (IV SOLUTION) ×4 IMPLANT
YANKAUER SUCT BULB TIP NO VENT (SUCTIONS) ×4 IMPLANT

## 2016-01-24 NOTE — Brief Op Note (Addendum)
01/19/2016 - 01/24/2016  10:00 PM  PATIENT:  Ronald Meyer  25 y.o. male  PRE-OPERATIVE DIAGNOSIS:   1. LEFT POSTERIOR WALL ACETABULAR FRACTURE DISLOCATION S/P PROVISIONAL REDUCTION 2. RIGHT DISTAL RADIUS FRACTURE AND ULNAR STYLOID FRACTURE  POST-OPERATIVE DIAGNOSIS:   1. LEFT POSTERIOR WALL ACETABULAR FRACTURE DISLOCATION S/P PROVISIONAL REDUCTION 2. RIGHT DISTAL RADIUS FRACTURE AND ULNAR STYLOID FRACTURE  PROCEDURE:  Procedure(s): 1. OPEN REDUCTION INTERNAL FIXATION (ORIF) LEFT POSTERIOR WALL ACETABULAR FRACTURE (Left) 2. OPEN TREATMENT OF POSTERIOR HIP DISLOCATION WITH CAPSULAR and LABRAL REPAIR 3. OPEN REDUCTION INTERNAL FIXATION (ORIF) WRIST FRACTURE (Right) 4. PINNING OF DRUJ FOR INSTABILITY (ULNAR STYLOID FRACTURE)  SURGEON:  Surgeon(s) and Role:    * Myrene GalasMichael Alvina Strother, MD - Primary  PHYSICIAN ASSISTANT: Montez MoritaKeith Paul, PA-C  ANESTHESIA:   general  I/O:     SPECIMEN:  No Specimen  TOURNIQUET:  * No tourniquets in log *  DICTATION: .Other Dictation: Dictation Number [id number]

## 2016-01-24 NOTE — Progress Notes (Signed)
[facility] transportation set up for Fri. 7/6 at 10:30 AM for pt's 11:00 AM appointment at [facility]-radiology/oncology. Will continue to monitor

## 2016-01-24 NOTE — Anesthesia Postprocedure Evaluation (Signed)
Anesthesia Post Note  Patient: Ronald Meyer  Procedure(s) Performed: Procedure(s) (LRB): OPEN REDUCTION INTERNAL FIXATION (ORIF) ACETABULAR FRACTURE (Left) OPEN REDUCTION INTERNAL FIXATION (ORIF) WRIST FRACTURE (Right)  Patient location during evaluation: PACU Anesthesia Type: General Level of consciousness: awake and alert Pain management: pain level controlled Vital Signs Assessment: post-procedure vital signs reviewed and stable Respiratory status: spontaneous breathing, nonlabored ventilation, respiratory function stable and patient connected to nasal cannula oxygen Cardiovascular status: blood pressure returned to baseline and stable Postop Assessment: no signs of nausea or vomiting Anesthetic complications: no    Last Vitals:  Filed Vitals:   01/24/16 0500 01/24/16 1415  BP: 142/78   Pulse: 68   Temp: 36.6 C   Resp: 18 19    Last Pain:  Filed Vitals:   01/24/16 1417  PainSc: 10-Worst pain ever                 Reino KentJudd, Lavan Imes J
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
## 2016-01-24 NOTE — Transfer of Care (Signed)
Immediate Anesthesia Transfer of Care Note  Patient: Ronald Meyer  Procedure(s) Performed: Procedure(s): OPEN REDUCTION INTERNAL FIXATION (ORIF) ACETABULAR FRACTURE (Left) OPEN REDUCTION INTERNAL FIXATION (ORIF) WRIST FRACTURE (Right)  Patient Location: PACU  Anesthesia Type:General  Level of Consciousness: awake, alert , oriented and sedated  Airway & Oxygen Therapy: Patient Spontanous Breathing  Post-op Assessment: Report given to RN, Post -op Vital signs reviewed and stable and Patient moving all extremities  Post vital signs: Reviewed and stable  Last Vitals:  Filed Vitals:   01/24/16 0500 01/24/16 1415  BP: 142/78   Pulse: 68   Temp: 36.6 C   Resp: 18 19    Last Pain:  Filed Vitals:   01/24/16 1417  PainSc: 10-Worst pain ever      Patients Stated Pain Goal: 2 (01/24/16 0542)  Complications: No apparent anesthesia complications

## 2016-01-24 NOTE — Telephone Encounter (Signed)
Called [facility] [address] [address] spoke with RN Yvonna AlanisKaitlyn, and asked  That patient would need to be [facility], seither she or case manager Darl PikesSusan to arrange , patient needs to be here by 1100am for 1x treatment to his hip  adn to premedicate for pain , RN gave verbal understanding, thanked kaitlyn,  10:36 AM
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
## 2016-01-24 NOTE — Progress Notes (Addendum)
POST OP CHECK  S/P ORIF R DISTAL RADIUS; L ACETABULUM  Pain well controlled w/ PCA, paresthesia per baseline R median nerve but none of left LEX RUEX  Splint in place  Sens  Ax/R/M/U all intact but remains diminished M per preop baseline--"definitely not worse"  Mot   Ax/ R/ PIN/ M/ AIN/ U intact  Brisk CR  Elevated in foam block with ice pack   LLE Sens DPN, SPN, TN intact  Motor EHL, ext, flex, evers 5/5  DP 2+  Plan: WBAT thru R elbow with platform walker; TDWB only LLE   Defer knee MRI until tomorrow  Continue elevation and serial exams for subacute carpal tunnel   Myrene GalasMichael Braelynn Lupton, MD [facility] [phone] (p)

## 2016-01-24 NOTE — Anesthesia Procedure Notes (Signed)
Procedure Name: Intubation Date/Time: 01/24/2016 8:26 AM Performed by: Fransisca KaufmannMEYER, Rayshaun Needle E Pre-anesthesia Checklist: Patient identified, Emergency Drugs available, Suction available and Patient being monitored Patient Re-evaluated:Patient Re-evaluated prior to inductionOxygen Delivery Method: Circle System Utilized Preoxygenation: Pre-oxygenation with 100% oxygen Intubation Type: IV induction Ventilation: Mask ventilation without difficulty Laryngoscope Size: Mac and 3 Grade View: Grade I Tube type: Oral Tube size: 7.5 mm Number of attempts: 1 Airway Equipment and Method: Stylet and Oral airway Placement Confirmation: ETT inserted through vocal cords under direct vision,  positive ETCO2 and breath sounds checked- equal and bilateral Tube secured with: Tape Dental Injury: Teeth and Oropharynx as per pre-operative assessment  Comments: [facility]ub[facility] Dr [facility]entry R[facility]Judd [facility]

## 2016-01-24 NOTE — Telephone Encounter (Signed)
   Pre-Radiation Note:  Inpatient nurse name: Yvonna AlanisKaitlyn RN  Time Called: 1008am Inpatient nurse to call [facility]: Yes.    [facility] called to verify transportation: No. Time: will call in am 01/25/16  Patient Status:    Pain medication given:  Mobility Orders:  Treatment Site: Left acetabulm(left hip) Additional Injuries: right wrist  Consent:    Is patient able to sign consent: Yes.   Family member called: No. Name/Time:     Lowella PettiesMcElroy, Shanyce Daris Bruner, RN 01/24/2016,10:37 AM

## 2016-01-24 NOTE — Progress Notes (Signed)
Orthopedic Tech Progress Note Patient Details:  Arabella MerlesDeon Pickford May 27, 1991 [id number]  Patient ID: Arabella Merleseon Brickell, male   DOB: May 27, 1991, 25 y.o.   MRN: [id number] [facility]  Trinna PostMartinez, Hallee Mckenny J 01/24/2016, 8:14 PM

## 2016-01-25 ENCOUNTER — Telehealth: Payer: Self-pay | Admitting: *Deleted

## 2016-01-25 ENCOUNTER — Ambulatory Visit: Payer: Self-pay | Admitting: Radiation Oncology

## 2016-01-25 ENCOUNTER — Encounter (HOSPITAL_COMMUNITY): Payer: Self-pay | Admitting: Radiation Oncology

## 2016-01-25 ENCOUNTER — Encounter (HOSPITAL_COMMUNITY)
Admission: EM | Disposition: A | Discharge status: Home / Self Care | Payer: Self-pay | Source: Home / Self Care | Attending: Orthopedic Surgery

## 2016-01-25 ENCOUNTER — Ambulatory Visit
Admit: 2016-01-25 | Discharge: 2016-01-25 | Disposition: A | Discharge status: Home / Self Care | Payer: Self-pay | Attending: Radiation Oncology | Admitting: Radiation Oncology

## 2016-01-25 ENCOUNTER — Ambulatory Visit: Payer: MEDICAID | Admitting: Radiation Oncology

## 2016-01-25 DIAGNOSIS — S73005A Unspecified dislocation of left hip, initial encounter: Secondary | ICD-10-CM | POA: Insufficient documentation

## 2016-01-25 DIAGNOSIS — S32402A Unspecified fracture of left acetabulum, initial encounter for closed fracture: Secondary | ICD-10-CM

## 2016-01-25 LAB — COMPREHENSIVE METABOLIC PANEL
ALT: 74 U/L — ABNORMAL HIGH (ref 17–63)
ANION GAP: 6 (ref 5–15)
AST: 106 U/L — ABNORMAL HIGH (ref 15–41)
Albumin: 2.6 g/dL — ABNORMAL LOW (ref 3.5–5.0)
Alkaline Phosphatase: 45 U/L (ref 38–126)
BILIRUBIN TOTAL: 1 mg/dL (ref 0.3–1.2)
BUN: <5 mg/dL — ABNORMAL LOW (ref 6–20)
CHLORIDE: 101 mmol/L (ref 101–111)
CO2: 29 mmol/L (ref 22–32)
Calcium: 8.8 mg/dL — ABNORMAL LOW (ref 8.9–10.3)
Creatinine, Ser: 0.77 mg/dL (ref 0.61–1.24)
Glucose, Bld: 88 mg/dL (ref 65–99)
POTASSIUM: 4 mmol/L (ref 3.5–5.1)
Sodium: 136 mmol/L (ref 135–145)
TOTAL PROTEIN: 4.9 g/dL — AB (ref 6.5–8.1)

## 2016-01-25 LAB — CBC
HEMATOCRIT: 28.7 % — AB (ref 39.0–52.0)
HEMOGLOBIN: 10.1 g/dL — AB (ref 13.0–17.0)
MCH: 32.1 pg (ref 26.0–34.0)
MCHC: 35.2 g/dL (ref 30.0–36.0)
MCV: 91.1 fL (ref 78.0–100.0)
Platelets: 203 10*3/uL (ref 150–400)
RBC: 3.15 MIL/uL — AB (ref 4.22–5.81)
RDW: 13.7 % (ref 11.5–15.5)
WBC: 6.1 10*3/uL (ref 4.0–10.5)

## 2016-01-25 LAB — PROTIME-INR
INR: 1.02 (ref 0.00–1.49)
PROTHROMBIN TIME: 13.6 s (ref 11.6–15.2)

## 2016-01-25 SURGERY — OPEN REDUCTION INTERNAL FIXATION (ORIF) ACETABULAR FRACTURE
Anesthesia: General | Laterality: Right

## 2016-01-25 MED ORDER — HYDROMORPHONE HCL 1 MG/ML IJ SOLN
1.0000 mg | INTRAMUSCULAR | Status: DC | PRN
  Administered 2016-01-25 – 2016-01-28 (×25): 1 mg via INTRAVENOUS
  Filled 2016-01-25 (×26): qty 1

## 2016-01-25 MED ORDER — OXYCODONE-ACETAMINOPHEN 5-325 MG PO TABS: 1.0000 | ORAL_TABLET | Freq: Four times a day (QID) | ORAL | Status: DC | PRN

## 2016-01-25 MED ORDER — OXYCODONE HCL 5 MG PO TABS
5.0000 mg | ORAL_TABLET | ORAL | Status: DC | PRN
  Administered 2016-01-25 – 2016-01-28 (×14): 10 mg via ORAL
  Filled 2016-01-25 (×14): qty 2
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
## 2016-01-25 NOTE — Telephone Encounter (Signed)
Called [facility] spoke with secretary Dedra SkeensGwen, informed her [facility] here to trasnport patient back to their floor, sh weill let RN know 12:30 PM
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
## 2016-01-25 NOTE — Telephone Encounter (Signed)
   Pre-Radiation Note:  Inpatient nurse name: DoraRN  Time Called: 8:08 AM  Inpatient nurse to call [facility]: Yes.   already confirmed   [facility] called to verify transportation: Yes.   Time: 0745am  Patient Status:   Pain:  Pain medication given: Diluadid PCA Mobility Orders:  Treatment Site: Left hip Consent:    Is patient able to sign consent: Yes.   Family member called: No. Name/Time:      Ronald Meyer, Ronald Sahagun Bruner, RN 01/25/2016,8:08 AM

## 2016-01-26 ENCOUNTER — Inpatient Hospital Stay (HOSPITAL_COMMUNITY): Payer: MEDICAID

## 2016-01-26 LAB — CBC
HCT: 30.2 % — ABNORMAL LOW (ref 39.0–52.0)
Hemoglobin: 10.6 g/dL — ABNORMAL LOW (ref 13.0–17.0)
MCH: 31.8 pg (ref 26.0–34.0)
MCHC: 35.1 g/dL (ref 30.0–36.0)
MCV: 90.7 fL (ref 78.0–100.0)
PLATELETS: 273 10*3/uL (ref 150–400)
RBC: 3.33 MIL/uL — ABNORMAL LOW (ref 4.22–5.81)
RDW: 13.3 % (ref 11.5–15.5)
WBC: 7.7 10*3/uL (ref 4.0–10.5)

## 2016-01-26 LAB — PROTIME-INR
INR: 1.64 — AB (ref 0.00–1.49)
Prothrombin Time: 19.5 seconds — ABNORMAL HIGH (ref 11.6–15.2)

## 2016-01-26 LAB — BASIC METABOLIC PANEL
ANION GAP: 6 (ref 5–15)
BUN: 5 mg/dL — ABNORMAL LOW (ref 6–20)
CALCIUM: 9.1 mg/dL (ref 8.9–10.3)
CO2: 28 mmol/L (ref 22–32)
CREATININE: 0.81 mg/dL (ref 0.61–1.24)
Chloride: 99 mmol/L — ABNORMAL LOW (ref 101–111)
GLUCOSE: 101 mg/dL — AB (ref 65–99)
Potassium: 4.2 mmol/L (ref 3.5–5.1)
Sodium: 133 mmol/L — ABNORMAL LOW (ref 135–145)

## 2016-01-26 MED ORDER — DIAZEPAM 5 MG PO TABS
5.0000 mg | ORAL_TABLET | Freq: Four times a day (QID) | ORAL | Status: DC | PRN
  Administered 2016-01-26 – 2016-01-28 (×6): 5 mg via ORAL
  Filled 2016-01-26 (×6): qty 1

## 2016-01-26 MED ORDER — WARFARIN SODIUM 5 MG PO TABS
5.0000 mg | ORAL_TABLET | Freq: Once | ORAL | Status: AC
  Administered 2016-01-26: 5 mg via ORAL
  Filled 2016-01-26: qty 1
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
## 2016-01-26 NOTE — Progress Notes (Signed)
Patient complained of significant pain upon coming onto shift. Per patient, PCA dilaudid is not helping and patient would rather go back on previous pain regime prior to surgery. Per patient he was taking 10mg  oxycodone, 1mg  dilaudid IV, and percocet, rotating the meds around. Landau,MD on call, situation was explained to him. PCA discontinued and new pain regime ordered. Currently rotating between IV dilaudid and po oxycodone. Patient is much happier and pain seems to be better controlled. Will continue to monitor.
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
## 2016-01-26 NOTE — Progress Notes (Signed)
Spoke with patient over the phone regarding pain control and agitation.  Valium added.    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  Instructed on elevation of hand as well.   Ronald Meyer,Estella Malatesta P, MD

## 2016-01-26 NOTE — Discharge Instructions (Addendum)
[facility] Discharge Instructions   General Discharge Instructions  WEIGHT BEARING STATUS: Touchdown weightbearing Left leg. Nonweightbearing Right wrist, Weightbear as tolerated through Right elbow  RANGE OF MOTION/ACTIVITY: posterior hip precautions Left hip   Wound Care: dressing changes as needed to Left hip. Ok to wash Left hip incision with soap and water.  Do not remove splint on right arm and do not get splint wet   Discharge Wound Care Instructions  Do NOT apply any ointments, solutions or lotions to pin sites or surgical wounds.  These prevent needed drainage and even though solutions like hydrogen peroxide kill bacteria, they also damage cells lining the pin sites that help fight infection.  Applying lotions or ointments can keep the wounds moist and can cause them to breakdown and open up as well. This can increase the risk for infection. When in doubt call the office.  Surgical incisions should be dressed daily.  If any drainage is noted, use one layer of adaptic, then gauze, Kerlix, and an ace wrap.  Once the incision is completely dry and without drainage, it may be left open to air out.  Showering may begin 36-48 hours later.  Cleaning gently with soap and water.  Traumatic wounds should be dressed daily as well.    One layer of adaptic, gauze, Kerlix, then ace wrap.  The adaptic can be discontinued once the draining has ceased    If you have a wet to dry dressing: wet the gauze with saline the squeeze as much saline out so the gauze is moist (not soaking wet), place moistened gauze over wound, then place a dry gauze over the moist one, followed by Kerlix wrap, then ace wrap.    PAIN MEDICATION USE AND EXPECTATIONS  You have likely been given narcotic medications to help control your pain.  After a traumatic event that results in an fracture (broken bone) with or without surgery, it is ok to use narcotic pain medications to help control one's pain.  We  understand that everyone responds to pain differently and each individual patient will be evaluated on a regular basis for the continued need for narcotic medications. Ideally, narcotic medication use should last no more than 6-8 weeks (coinciding with fracture healing).   As a patient it is your responsibility as well to monitor narcotic medication use and report the amount and frequency you use these medications when you come to your office visit.   We would also advise that if you are using narcotic medications, you should take a dose prior to therapy to maximize you participation.  IF YOU ARE ON NARCOTIC MEDICATIONS IT IS NOT PERMISSIBLE TO OPERATE A MOTOR VEHICLE (MOTORCYCLE/CAR/TRUCK/MOPED) OR HEAVY MACHINERY DO NOT MIX NARCOTICS WITH OTHER CNS (CENTRAL NERVOUS SYSTEM) DEPRESSANTS SUCH AS ALCOHOL  Diet: as you were eating previously.  Can use over the counter stool softeners and bowel preparations, such as Miralax, to help with bowel movements.  Narcotics can be constipating.  Be sure to drink plenty of fluids    STOP SMOKING OR USING NICOTINE PRODUCTS!!!!  As discussed nicotine severely impairs your body's ability to heal surgical and traumatic wounds but also impairs bone healing.  Wounds and bone heal by forming microscopic blood vessels (angiogenesis) and nicotine is a vasoconstrictor (essentially, shrinks blood vessels).  Therefore, if vasoconstriction occurs to these microscopic blood vessels they essentially disappear and are unable to deliver necessary nutrients to the healing tissue.  This is one modifiable factor that you can do to  dramatically increase your chances of healing your injury.    (This means no smoking, no nicotine gum, patches, etc)  DO NOT USE NONSTEROIDAL ANTI-INFLAMMATORY DRUGS (NSAID'S)  Using products such as Advil (ibuprofen), Aleve (naproxen), Motrin (ibuprofen) for additional pain control during fracture healing can delay and/or prevent the healing response.  If  you would like to take over the counter (OTC) medication, Tylenol (acetaminophen) is ok.  However, some narcotic medications that are given for pain control contain acetaminophen as well. Therefore, you should not exceed more than 4000 mg of tylenol in a day if you do not have liver disease.  Also note that there are may OTC medicines, such as cold medicines and allergy medicines that my contain tylenol as well.  If you have any questions about medications and/or interactions please ask your doctor/PA or your pharmacist.      ICE AND ELEVATE INJURED/OPERATIVE EXTREMITY  Using ice and elevating the injured extremity above your heart can help with swelling and pain control.  Icing in a pulsatile fashion, such as 20 minutes on and 20 minutes off, can be followed.    Do not place ice directly on skin. Make sure there is a barrier between to skin and the ice pack.    Using frozen items such as frozen peas works well as the conform nicely to the are that needs to be iced.  USE AN ACE WRAP OR TED HOSE FOR SWELLING CONTROL  In addition to icing and elevation, Ace wraps or TED hose are used to help limit and resolve swelling.  It is recommended to use Ace wraps or TED hose until you are informed to stop.    When using Ace Wraps start the wrapping distally (farthest away from the body) and wrap proximally (closer to the body)   Example: If you had surgery on your leg or thing and you do not have a splint on, start the ace wrap at the toes and work your way up to the thigh        If you had surgery on your upper extremity and do not have a splint on, start the ace wrap at your fingers and work your way up to the upper arm  IF YOU ARE IN A SPLINT OR CAST DO NOT REMOVE IT FOR ANY REASON   If your splint gets wet for any reason please contact the office immediately. You may shower in your splint or cast as long as you keep it dry.  This can be done by wrapping in a cast cover or garbage back (or similar)  Do Not  stick any thing down your splint or cast such as pencils, money, or hangers to try and scratch yourself with.  If you feel itchy take benadryl as prescribed on the bottle for itching  IF YOU ARE IN A CAM BOOT (BLACK BOOT)  You may remove boot periodically. Perform daily dressing changes as noted below.  Wash the liner of the boot regularly and wear a sock when wearing the boot. It is recommended that you sleep in the boot until told otherwise  CALL THE OFFICE WITH ANY QUESTIONS OR CONCERNS: [phone]     Information on my medicine - Coumadin   (Warfarin)  This medication education was reviewed with me or my healthcare representative as part of my discharge preparation.  The pharmacist that spoke with me during my hospital stay was:  Allena Katz, [facility]  Why was Coumadin prescribed for you? Coumadin was  prescribed for you because you have a blood clot or a medical condition that can cause an increased risk of forming blood clots. Blood clots can cause serious health problems by blocking the flow of blood to the heart, lung, or brain. Coumadin can prevent harmful blood clots from forming. As a reminder your indication for Coumadin is:   Blood Clot Prevention After Orthopedic Surgery  What test will check on my response to Coumadin? While on Coumadin (warfarin) you will need to have an INR test regularly to ensure that your dose is keeping you in the desired range. The INR (international normalized ratio) number is calculated from the result of the laboratory test called prothrombin time (PT).  If an INR APPOINTMENT HAS NOT ALREADY BEEN MADE FOR YOU please schedule an appointment to have this lab work done by your health care provider within 7 days. Your INR goal is usually a number between:  2 to 3 or your provider may give you a more narrow range like 2-2.5.  Ask your health care provider during an office visit what your goal INR is.  What  do you need to  know  About  COUMADIN? Take  Coumadin (warfarin) exactly as prescribed by your healthcare provider about the same time each day.  DO NOT stop taking without talking to the doctor who prescribed the medication.  Stopping without other blood clot prevention medication to take the place of Coumadin may increase your risk of developing a new clot or stroke.  Get refills before you run out.  What do you do if you miss a dose? If you miss a dose, take it as soon as you remember on the same day then continue your regularly scheduled regimen the next day.  Do not take two doses of Coumadin at the same time.  Important Safety Information A possible side effect of Coumadin (Warfarin) is an increased risk of bleeding. You should call your healthcare provider right away if you experience any of the following: ? Bleeding from an injury or your nose that does not stop. ? Unusual colored urine (red or dark brown) or unusual colored stools (red or black). ? Unusual bruising for unknown reasons. ? A serious fall or if you hit your head (even if there is no bleeding).  Some foods or medicines interact with Coumadin (warfarin) and might alter your response to warfarin. To help avoid this: ? Eat a balanced diet, maintaining a consistent amount of Vitamin K. ? Notify your provider about major diet changes you plan to make. ? Avoid alcohol or limit your intake to 1 drink for women and 2 drinks for men per day. (1 drink is 5 oz. wine, 12 oz. beer, or 1.5 oz. liquor.)  Make sure that ANY health care provider who prescribes medication for you knows that you are taking Coumadin (warfarin).  Also make sure the healthcare provider who is monitoring your Coumadin knows when you have started a new medication including herbals and non-prescription products.  Coumadin (Warfarin)  Major Drug Interactions  Increased Warfarin Effect Decreased Warfarin Effect  Alcohol (large quantities) Antibiotics (esp. Septra/Bactrim, Flagyl, Cipro) Amiodarone  (Cordarone) Aspirin (ASA) Cimetidine (Tagamet) Megestrol (Megace) NSAIDs (ibuprofen, naproxen, etc.) Piroxicam (Feldene) Propafenone (Rythmol SR) Propranolol (Inderal) Isoniazid (INH) Posaconazole (Noxafil) Barbiturates (Phenobarbital) Carbamazepine (Tegretol) Chlordiazepoxide (Librium) Cholestyramine (Questran) Griseofulvin Oral Contraceptives Rifampin Sucralfate (Carafate) Vitamin K   Coumadin (Warfarin) Major Herbal Interactions  Increased Warfarin Effect Decreased Warfarin Effect  Garlic Ginseng Ginkgo biloba Coenzyme Q10 Green tea  St. Johns wort    Coumadin (Warfarin) FOOD Interactions  Eat a consistent number of servings per week of foods HIGH in Vitamin K (1 serving =  cup)  Collards (cooked, or boiled & drained) Kale (cooked, or boiled & drained) Mustard greens (cooked, or boiled & drained) Parsley *serving size only =  cup Spinach (cooked, or boiled & drained) Swiss chard (cooked, or boiled & drained) Turnip greens (cooked, or boiled & drained)  Eat a consistent number of servings per week of foods MEDIUM-HIGH in Vitamin K (1 serving = 1 cup)  Asparagus (cooked, or boiled & drained) Broccoli (cooked, boiled & drained, or raw & chopped) Brussel sprouts (cooked, or boiled & drained) *serving size only =  cup Lettuce, raw (green leaf, endive, romaine) Spinach, raw Turnip greens, raw & chopped   These websites have more information on Coumadin (warfarin):  http://www.king-russell.com/; https://www.hines.net/;

## 2016-01-27 LAB — BASIC METABOLIC PANEL
ANION GAP: 8 (ref 5–15)
BUN: 9 mg/dL (ref 6–20)
CO2: 28 mmol/L (ref 22–32)
CREATININE: 0.85 mg/dL (ref 0.61–1.24)
Calcium: 9.2 mg/dL (ref 8.9–10.3)
Chloride: 98 mmol/L — ABNORMAL LOW (ref 101–111)
Glucose, Bld: 94 mg/dL (ref 65–99)
POTASSIUM: 3.9 mmol/L (ref 3.5–5.1)
SODIUM: 134 mmol/L — AB (ref 135–145)

## 2016-01-27 LAB — CBC
HEMATOCRIT: 29.3 % — AB (ref 39.0–52.0)
HEMOGLOBIN: 9.9 g/dL — AB (ref 13.0–17.0)
MCH: 30.8 pg (ref 26.0–34.0)
MCHC: 33.8 g/dL (ref 30.0–36.0)
MCV: 91.3 fL (ref 78.0–100.0)
Platelets: 359 10*3/uL (ref 150–400)
RBC: 3.21 MIL/uL — AB (ref 4.22–5.81)
RDW: 13.1 % (ref 11.5–15.5)
WBC: 7.1 10*3/uL (ref 4.0–10.5)

## 2016-01-27 LAB — PROTIME-INR
INR: 1.77 — AB (ref 0.00–1.49)
PROTHROMBIN TIME: 20.6 s — AB (ref 11.6–15.2)

## 2016-01-27 MED ORDER — WARFARIN SODIUM 5 MG PO TABS
5.0000 mg | ORAL_TABLET | Freq: Every day | ORAL | Status: AC
  Administered 2016-01-27: 5 mg via ORAL
  Filled 2016-01-27: qty 1

## 2016-01-27 NOTE — Progress Notes (Signed)
Subjective: 3 Days Post-Op Procedure(s) (LRB): OPEN REDUCTION INTERNAL FIXATION (ORIF) ACETABULAR FRACTURE (Left) OPEN REDUCTION INTERNAL FIXATION (ORIF) WRIST FRACTURE (Right) Patient reports pain as mild.  Doing much better today since getting valium.  No chest pain/sob, lightheadedness/dizziness, nausea/vomiting.    Objective: Vital signs in last 24 hours: Temp:  [98.4 F (36.9 C)-98.7 F (37.1 C)] 98.4 F (36.9 C) (07/09 0411) Pulse Rate:  [97-106] 97 (07/09 0411) Resp:  [16] 16 (07/09 0411) BP: (127-151)/(68-85) 127/75 mmHg (07/09 0411) SpO2:  [97 %-100 %] 99 % (07/09 0411)  Intake/Output from previous day: 07/08 0701 - 07/09 0700 In: 480 [P.O.:480] Out: 1050 [Urine:1050] Intake/Output this shift:     Recent Labs  01/24/16 1817 01/25/16 0613 01/26/16 0520 01/27/16 0548  HGB 10.2* 10.1* 10.6* 9.9*    Recent Labs  01/26/16 0520 01/27/16 0548  WBC 7.7 7.1  RBC 3.33* 3.21*  HCT 30.2* 29.3*  PLT 273 359    Recent Labs  01/26/16 0520 01/27/16 0548  NA 133* 134*  K 4.2 3.9  CL 99* 98*  CO2 28 28  BUN 5* 9  CREATININE 0.81 0.85  GLUCOSE 101* 94  CALCIUM 9.1 9.2    Recent Labs  01/26/16 0520 01/27/16 0548  INR 1.64* 1.77*    Neurologically intact Neurovascular intact Sensation intact distally Intact pulses distally Dorsiflexion/Plantar flexion intact Compartment soft  LLE-mild drainage noted to dressing.  Changed mepilex today. RUE-splint in place and well positioned.  Still has tingling to thumb, index and long fingers  Assessment/Plan: 3 Days Post-Op Procedure(s) (LRB): OPEN REDUCTION INTERNAL FIXATION (ORIF) ACETABULAR FRACTURE (Left) OPEN REDUCTION INTERNAL FIXATION (ORIF) WRIST FRACTURE (Right) Advance diet Up with therapy  LLE-TDWB-posterior hip precautions RUE-continue splint.  Ok to move shoulder to elbow, no wrist motion though. ABLA-mild and stable  Ronald Meyer 01/27/2016, 10:20 AM
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
## 2016-01-28 ENCOUNTER — Encounter (HOSPITAL_COMMUNITY): Payer: Self-pay | Admitting: Orthopedic Surgery

## 2016-01-28 ENCOUNTER — Institutional Professional Consult (permissible substitution): Payer: Self-pay | Admitting: Radiation Oncology

## 2016-01-28 DIAGNOSIS — S83529A Sprain of posterior cruciate ligament of unspecified knee, initial encounter: Secondary | ICD-10-CM

## 2016-01-28 DIAGNOSIS — F10929 Alcohol use, unspecified with intoxication, unspecified: Secondary | ICD-10-CM | POA: Diagnosis present

## 2016-01-28 DIAGNOSIS — S52501A Unspecified fracture of the lower end of right radius, initial encounter for closed fracture: Secondary | ICD-10-CM | POA: Diagnosis present

## 2016-01-28 DIAGNOSIS — M12569 Traumatic arthropathy, unspecified knee: Secondary | ICD-10-CM | POA: Diagnosis present

## 2016-01-28 DIAGNOSIS — S32402A Unspecified fracture of left acetabulum, initial encounter for closed fracture: Secondary | ICD-10-CM | POA: Diagnosis present

## 2016-01-28 DIAGNOSIS — S73005A Unspecified dislocation of left hip, initial encounter: Secondary | ICD-10-CM | POA: Diagnosis present

## 2016-01-28 HISTORY — DX: Sprain of posterior cruciate ligament of unspecified knee, initial encounter: S83.529A

## 2016-01-28 HISTORY — DX: Traumatic arthropathy, unspecified knee: M12.569

## 2016-01-28 LAB — PROTIME-INR
INR: 1.63 — ABNORMAL HIGH (ref 0.00–1.49)
Prothrombin Time: 19.3 seconds — ABNORMAL HIGH (ref 11.6–15.2)

## 2016-01-28 MED ORDER — OXYCODONE-ACETAMINOPHEN 5-325 MG PO TABS: 1.0000 | ORAL_TABLET | Freq: Four times a day (QID) | ORAL | Status: AC | PRN

## 2016-01-28 MED ORDER — ENOXAPARIN SODIUM 40 MG/0.4ML ~~LOC~~ SOLN: 40.0000 mg | SUBCUTANEOUS | Status: AC

## 2016-01-28 MED ORDER — BACITRACIN-NEOMYCIN-POLYMYXIN 400-5-5000 EX OINT
TOPICAL_OINTMENT | CUTANEOUS | Status: AC
  Administered 2016-01-28: 14:00:00
  Filled 2016-01-28: qty 1

## 2016-01-28 MED ORDER — METHOCARBAMOL 500 MG PO TABS: 500.0000 mg | ORAL_TABLET | Freq: Four times a day (QID) | ORAL | Status: AC | PRN

## 2016-01-28 MED ORDER — WARFARIN SODIUM 7.5 MG PO TABS: 7.5000 mg | ORAL_TABLET | Freq: Once | ORAL | Status: DC

## 2016-01-28 MED ORDER — ENOXAPARIN (LOVENOX) PATIENT EDUCATION KIT
PACK | Freq: Once | Status: AC
  Administered 2016-01-28: 15:00:00
  Filled 2016-01-28: qty 1

## 2016-01-28 MED ORDER — DOCUSATE SODIUM 100 MG PO CAPS: 100.0000 mg | ORAL_CAPSULE | Freq: Two times a day (BID) | ORAL | Status: AC

## 2016-01-28 MED ORDER — WARFARIN SODIUM 7.5 MG PO TABS: 7.5000 mg | ORAL_TABLET | Freq: Once | ORAL | Status: AC

## 2016-01-28 MED ORDER — OXYCODONE HCL 5 MG PO TABS: 5.0000 mg | ORAL_TABLET | Freq: Four times a day (QID) | ORAL | Status: AC | PRN

## 2016-01-28 MED FILL — WARFARIN SODIUM 7.5 MG TAB: 7.5 | 34 days supply | Qty: 34 | Fill #0

## 2016-01-28 MED FILL — ENOXAPARIN 40 MG/0.4 ML SYR: 40 | 5 days supply | Qty: 2 | Fill #0

## 2016-01-28 MED FILL — METHOCARBAMOL 500 MG TABLET: 500 | 13 days supply | Qty: 90 | Fill #0 | Status: TO

## 2016-01-28 MED FILL — oxyCODONE HCL 5 MG TABS: 5 | 8 days supply | Qty: 80 | Fill #0

## 2016-01-28 MED FILL — OXYCODONE/APAP 5-325: 5-325 | 12 days supply | Qty: 90 | Fill #0

## 2016-01-28 NOTE — Progress Notes (Signed)
PT Cancellation Note  Patient Details Name: Ronald Meyer MRN: [id number] DOB: August 15, 1990   Cancelled Treatment:    Reason Eval/Treat Not Completed: Patient declined, no reason specified. Pt requested to wait until his friends arrived later today to perform transfer into w/c to leave his room. Pt stated that he has been performing transfers and ambulating with his girlfriend assisting him and use of platform RW. PT educated pt on safety and technique with ascending and descending stairs in w/c; however, pt stated that he would do it how he wanted to when he returned home. PT also reviewed WB precautions with pt and encouraged pt to be cautious when at home. PT will follow up with pt as appropriate.   Alessandra BevelsJennifer M Bodee Lafoe 01/28/2016, 12:14 PM Deborah ChalkJennifer Lathon Adan, PT, DPT [phone]

## 2016-01-28 NOTE — Progress Notes (Signed)
Pt d/c'd  from hospital in stable condition. Discharge instructions and medications were reviewed with patient and his girlfriend. Lovenox teaching and a demostration of how and where to inject medication was done with pt and his girlfriend. Pt was given the lovonox kit and brochure and information on coumadin these were also reviewed with pt and his girlfriend. Pt was assisted to his transportation with his girlfriend and a friend of pt.

## 2016-01-28 NOTE — Progress Notes (Signed)
Orthopedic Tech Progress Note Patient Details:  Arabella MerlesDeon Mcinerney 08/03/1990 [id number]  Patient ID: Arabella Merleseon Ruddy, male   DOB: 08/03/1990, 25 y.o.   MRN: [id number]   Saul FordyceJennifer C Eivin Mascio 01/28/2016, 12:24 PMCalled [employer] for left Hinged knee brace.
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
## 2016-02-10 NOTE — Progress Notes (Signed)
  Radiation Oncology         [phone]) [phone] ________________________________  Name: Stevens Puentes MRN: [id number]  Date: 01/25/2016  DOB: 08/30/1990  End of Treatment Note  Diagnosis:   Postoperative heterotopic ossification     Indication for treatment:  curative       Radiation treatment dates:   01/25/2016  Site/dose:   The patient was treated to a dose of 7 Gy to the left hip. This was accomplished with AP and PA fields.  A clamshell device was utilized to minimize the dose to the testicular region.  Narrative: The patient tolerated radiation treatment relatively well.   No difficulties.  Plan: The patient has completed radiation treatment. The patient will return to radiation oncology clinic on an as needed basis. ________________________________  Radene Gunning, M.D., Ph.D.

## 2016-02-10 NOTE — Addendum Note (Signed)
Encounter addended by: Dorothy Puffer, MD on: 02/10/2016 12:38 PM<BR>    Actions taken: Sign clinical note
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
